# Patient Record
Sex: Male | Born: 2000 | Race: Black or African American | Hispanic: No | Marital: Single | Smoking: Never smoker
Health system: Southern US, Community
[De-identification: ages and names within clinical notes are randomized; demographics above are authoritative.]

## PROBLEM LIST (undated history)

## (undated) DIAGNOSIS — L509 Urticaria, unspecified: Secondary | ICD-10-CM

## (undated) DIAGNOSIS — L309 Dermatitis, unspecified: Secondary | ICD-10-CM

## (undated) HISTORY — PX: WISDOM TOOTH EXTRACTION: SHX21

## (undated) HISTORY — DX: Urticaria, unspecified: L50.9

## (undated) HISTORY — DX: Dermatitis, unspecified: L30.9

---
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
## 2022-01-12 ENCOUNTER — Encounter: Payer: Self-pay | Admitting: Urology

## 2022-01-12 ENCOUNTER — Telehealth: Payer: Self-pay

## 2022-01-12 ENCOUNTER — Other Ambulatory Visit
Admission: RE | Admit: 2022-01-12 | Discharge: 2022-01-12 | Disposition: A | Discharge status: Home / Self Care | Payer: Medicaid Other | Attending: Urology | Admitting: Urology

## 2022-01-12 ENCOUNTER — Other Ambulatory Visit: Payer: Self-pay | Admitting: *Deleted

## 2022-01-12 ENCOUNTER — Ambulatory Visit (INDEPENDENT_AMBULATORY_CARE_PROVIDER_SITE_OTHER): Payer: Medicaid Other | Admitting: Urology

## 2022-01-12 VITALS — BP 127/75 | HR 59 | Ht 74.0 in | Wt 208.0 lb

## 2022-01-12 DIAGNOSIS — R3989 Other symptoms and signs involving the genitourinary system: Secondary | ICD-10-CM

## 2022-01-12 DIAGNOSIS — R300 Dysuria: Secondary | ICD-10-CM | POA: Insufficient documentation

## 2022-01-12 DIAGNOSIS — Z113 Encounter for screening for infections with a predominantly sexual mode of transmission: Secondary | ICD-10-CM

## 2022-01-12 LAB — BLADDER SCAN AMB NON-IMAGING

## 2022-01-12 LAB — URINALYSIS, COMPLETE (UACMP) WITH MICROSCOPIC
Bilirubin Urine: NEGATIVE
Glucose, UA: NEGATIVE mg/dL
Hgb urine dipstick: NEGATIVE
Ketones, ur: NEGATIVE mg/dL
Nitrite: NEGATIVE
Protein, ur: NEGATIVE mg/dL
Specific Gravity, Urine: 1.025 (ref 1.005–1.030)
pH: 6 (ref 5.0–8.0)

## 2022-01-12 MED ORDER — DOXYCYCLINE HYCLATE 100 MG PO CAPS: 100.0000 mg | ORAL_CAPSULE | Freq: Two times a day (BID) | ORAL | 0 refills | Status: DC

## 2022-01-12 NOTE — Telephone Encounter (Signed)
Called pt no answer. Left detailed message for pt per DPR. RX sent.

## 2022-01-12 NOTE — Patient Instructions (Signed)
Urethritis, Adult  Urethritis is a swelling (inflammation) of the urethra. The urethra is the tube that drains urine from the bladder. It is important to get treatment for this condition early. Delayed treatment may lead to complications, such as an infection in the urinary tract (ureters, kidneys, and bladder). What are the causes? This condition may be caused by: Germs that are spread through sexual contact. This is the leading cause of urethritis. This may include bacterial or viral infections. Injury to the urethra. This can happen after a thin, flexible tube (catheter) is inserted into the urethra to drain urine, or after medical instruments or foreign bodies are inserted into the area. Chemical irritation. This may include contact with spermicide or prolonged contact with chemicals in bubble bath, shampoo, or perfumed soaps. A disease that causes inflammation. This is rare. What increases the risk? The following factors may make you more likely to develop this condition: Having sex without using a condom. Having multiple sexual partners. Having poor hygiene. What are the signs or symptoms? Symptoms of this condition include: Pain with urination. Frequent urination. An urgent need to urinate. Itching and pain in the vagina or penis. Discharge or bleeding coming from the penis. Most women have no symptoms. How is this diagnosed? This condition may be diagnosed based on: Your symptoms. Your medical history. A physical exam. Tests may also be done. These may include: Urine tests. Swabs from the urethra. How is this treated? Treatment for this condition depends on the cause. Urethritis caused by a bacterial infection is treated with antibiotic medicine. Sexual partners must also be treated. Follow these instructions at home: Medicines Take over-the-counter and prescription medicines only as told by your health care provider. If you were prescribed an antibiotic, take it as told  by your health care provider. Do not stop taking the antibiotic even if you start to feel better. Lifestyle Avoid using perfumed soaps, bubble bath, and shampoo when you bathe or shower. Rinse the vaginal area after bathing. Wear cotton underwear. Not wearing underwear when going to sleep can help. Make sure to wipe from front to back after using the toilet if you are male. Do not have sex until your health care provider approves. When you do have sex, be sure to practice safe sex. Any sexual partners you have had in the past 60 days should be treated. General instructions Drink enough fluid to keep your urine pale yellow. It is up to you to get your test results. Ask your health care provider, or the department that is doing the test, when your results will be ready. Keep all follow-up visits. This is important. Get tested again 3 months after treatment to make sure the infection is gone. It is important that your sexual partner also gets tested again. Contact a health care provider if: Your symptoms have not improved after 3 days. Your symptoms get worse. You have eye redness or pain. You develop abdominal pain or pelvic pain (in females). You develop joint pain or a rash. You have a fever or chills. Get help right away if: You have severe pain in the belly, back, or side. You vomit repeatedly. Summary Urethritis is a swelling (inflammation) of the urethra. Germs that are spread through sexual contact are the most common cause of this condition. It is important to get treatment for this condition early. Delayed treatment may lead to complications. Treatment for this condition depends on the cause. Any sexual partners must also be treated. This information is not   intended to replace advice given to you by your health care provider. Make sure you discuss any questions you have with your health care provider. Document Revised: 01/20/2020 Document Reviewed: 01/20/2020 Elsevier Patient  Education  2023 Elsevier Inc.   Preventing Sexually Transmitted Infections, Adult Sexually transmitted infections (STIs) are spread from person to person (are contagious). They are spread, or transmitted, through bodily fluids exchanged during sex or sexual contact. These bodily fluids include saliva, semen, blood, vaginal mucus, and urine. STIs are very common and can occur among people of all ages. Some common STIs include: Herpes. Hepatitis B. Chlamydia. Gonorrhea. Syphilis. HPV (human papillomavirus). HIV, also called the human immunodeficiency virus. This is the virus that can cause AIDS (acquired immunodeficiency syndrome). Often, people who have these STIs do not have symptoms. Even without symptoms, these infections can be spread from person to person and require treatment. How can these conditions affect me? STIs can be treated, and many STIs can be cured. However, some STIs cannot be cured and will affect you for the rest of your life. Certain STIs may: Require you to take medicine for the rest of your life. Affect your ability to have children (your fertility). Increase your risk for developing another STI or certain serious health conditions. These may include: Cervical cancer. Head and neck cancer. Pelvic inflammatory disease (PID), in women. Organ damage or damage to other parts of your body, if the infection spreads. Cause problems during pregnancy and may be transmitted to the baby during the pregnancy or childbirth. What can increase my risk? You may have an increased risk for developing an STI if: You have unprotected sex or sexual contact. You have more than one sex partner. You have a sex partner who has multiple sex partners. You have sexual contact with someone who has an STI. You have an STI or you had an STI before. You inject drugs or have a sex partner or partners who inject drugs. What actions can I take to prevent STIs? The only way to completely prevent  STIs is not to have sex of any kind. This is called practicing abstinence. If you are sexually active, you can protect yourself and others by taking these actions to lower your risk of getting an STI: Lifestyle Avoid mixing alcohol, drugs, and sex. Alcohol and drug use can affect your ability to make good decisions and can lead to risky sexual behaviors. Medicines Ask your health care provider about taking pre-exposure prophylaxis (PrEP) to prevent HIV infection. General information  Stay up to date on vaccinations. Certain vaccines can lower your risk of getting certain STIs, such as: Hepatitis A and hepatitis B vaccines. HPV (human papillomavirus) vaccine. Have only one sex partner (be monogamous) or limit the number of sex partners you have. Use methods that prevent the exchange of body fluids between partners (barrier protection) correctly every time you have sexual contact. Barrier protection can be used during oral, vaginal, or anal sex. Commonly used barrier methods include: External (male) condom. Internal (male) condom. Dental dam. Use a new barrier method for every sex act from start to finish. Get tested for STIs. Have your partners get tested, too. If you test positive for an STI, follow recommendations from your health care provider about treatment and make sure your sex partners are tested and treated. Birth control pills, injections, implants, and intrauterine devices (IUDs) do not protect against STIs. To prevent both STIs and pregnancy, always use a condom with another form of birth control. Some STIs,  such as herpes, are spread through skin-to-skin contact. A barrier method may not protect you from getting such STIs. Avoid all sexual contact if you or your partners have herpes and there is an active flare with open sores. Where to find more information Learn more about STIs from: Centers for Disease Control and Prevention: More information about specific STIs:  BloggingList.ca Places to get sexual health counseling and treatment for free or at a low cost: gettested.TonerPromos.no U.S. Department of Health and Human Services: http://hoffman.com/ Summary Sexually transmitted infections (STIs) can spread through exchanging bodily fluids during sexual contact. Fluids include saliva, semen, blood, vaginal mucus, and urine. You may have an increased risk for developing an STI if you have unprotected sex. If you do have sex, limit your number of sex partners and use barrier protection every time you have sex. This information is not intended to replace advice given to you by your health care provider. Make sure you discuss any questions you have with your health care provider. Document Revised: 06/08/2021 Document Reviewed: 07/30/2019 Elsevier Patient Education  2023 [employer].

## 2022-01-12 NOTE — Telephone Encounter (Signed)
-----   Message from Sondra Come, MD sent at 01/12/2022  1:05 PM EDT ----- Urinalysis today is suspicious for infection.  Recommend doxycycline 100 mg twice daily x7 days.  Cautioned him that this can cause rash if significant sun exposure and to wear long sleeves, hat, and sunscreen if out in the sun.  We will call with final culture and STD results and if antibiotics need to be changed  Legrand Rams, MD 01/12/2022

## 2022-01-12 NOTE — Progress Notes (Signed)
   01/12/22 12:33 PM   Derek Holland January 02, 2001 [id number]  CC: Penile discharge, dysuria  HPI: I saw Mr. Derek Holland today for the above issues.  He reports about a month of some mucousy discharge from the penis as well as some burning with urination.  He took some over-the-counter Pyridium which almost completely resolved the dysuria, and he really denies any significant complaints today aside from some mild mucus discharge.  This seems to have improved over the last few weeks as well.  He denies any gross hematuria.  Urinalysis with PCP was benign and he was referred to urology.  I do not see that any STD testing was performed.  He is sexually active with 1 partner.  Urinalysis today suspicious with moderate leukocytes, 0-5 squamous cells, 20-50 WBCs, 6-10 RBCs, few bacteria.  Sent for culture, atypicals, and STDs.  PMH: Past Medical History:  Diagnosis Date   Eczema    Urticaria     Surgical History: Past Surgical History:  Procedure Laterality Date   COLONOSCOPY WITH PROPOFOL N/A 03/27/2021   Procedure: COLONOSCOPY WITH PROPOFOL;  Surgeon: Pasty Spillers, MD;  Location: [facility] ENDOSCOPY;  Service: Endoscopy;  Laterality: N/A;   ESOPHAGOGASTRODUODENOSCOPY N/A 03/27/2021   Procedure: ESOPHAGOGASTRODUODENOSCOPY (EGD);  Surgeon: Pasty Spillers, MD;  Location: [facility] ENDOSCOPY;  Service: Endoscopy;  Laterality: N/A;   WISDOM TOOTH EXTRACTION      Social History:  reports that he has never smoked. He has never been exposed to tobacco smoke. He has never used smokeless tobacco. He reports that he does not drink alcohol and does not use drugs.  Physical Exam: BP 127/75   Pulse (!) 59   Ht 6\' 2"  (1.88 m)   Wt 208 lb (94.3 kg)   BMI 26.71 kg/m    Constitutional:  Alert and oriented, No acute distress. Cardiovascular: No clubbing, cyanosis, or edema. Respiratory: Normal respiratory effort, no increased work of breathing. GI: Abdomen is soft, nontender, nondistended, no abdominal  masses GU: Circumcised phallus with patent meatus, no lesions, testicles 20 cc and descended bilaterally without masses   Assessment & Plan:   21 year old newly sexually active male with 1 male partner who reports about 1 month of some penile discharge as well as a intermittent dysuria that is improving.  Exam today is benign but urinalysis suspicious for infection with 0-5 squamous cells, 20-50 WBCs, 6-10 RBCs, few bacteria, moderate leukocytes, sent for urine culture, atypicals, and STD panel.  We will call with urine culture and STD results Consider empiric course of doxycycline if culture is negative based on suspicious urinalysis  12, MD 01/12/2022  [facility] [address] [place], [place] [phone]

## 2022-01-13 LAB — URINE CULTURE: Culture: NO GROWTH

## 2022-01-15 ENCOUNTER — Telehealth: Payer: Self-pay

## 2022-01-15 LAB — GC/CHLAMYDIA PROBE AMP
Chlamydia trachomatis, NAA: POSITIVE — AB
Neisseria Gonorrhoeae by PCR: NEGATIVE

## 2022-01-15 NOTE — Telephone Encounter (Signed)
Called pt no answer. LM for pt to call back. 1st attempt  

## 2022-01-15 NOTE — Telephone Encounter (Signed)
Pt returns call. Informed pt of information below. Pt advised on safe sex practices.

## 2022-01-15 NOTE — Telephone Encounter (Signed)
-----   Message from Sondra Come, MD sent at 01/15/2022 12:44 PM EDT ----- His STD test was positive for chlamydia, this will be covered by the doxycycline he was prescribed.  He needs to have his partner(s) tested and they should both complete antibiotics prior to resuming sexual activity  Legrand Rams, MD 01/15/2022

## 2022-01-19 LAB — MISC LABCORP TEST (SEND OUT): Labcorp test code: 86884

## 2022-06-11 ENCOUNTER — Ambulatory Visit: Payer: Medicaid Other | Admitting: Allergy

## 2022-07-16 ENCOUNTER — Other Ambulatory Visit: Payer: Self-pay

## 2022-07-16 ENCOUNTER — Encounter: Payer: Self-pay | Admitting: Allergy

## 2022-07-16 ENCOUNTER — Ambulatory Visit (INDEPENDENT_AMBULATORY_CARE_PROVIDER_SITE_OTHER): Payer: Medicaid Other | Admitting: Allergy

## 2022-07-16 VITALS — BP 124/76 | HR 63 | Temp 98.3°F | Resp 18

## 2022-07-16 DIAGNOSIS — J3089 Other allergic rhinitis: Secondary | ICD-10-CM

## 2022-07-16 DIAGNOSIS — L508 Other urticaria: Secondary | ICD-10-CM

## 2022-07-16 DIAGNOSIS — H1013 Acute atopic conjunctivitis, bilateral: Secondary | ICD-10-CM

## 2022-07-16 DIAGNOSIS — T781XXD Other adverse food reactions, not elsewhere classified, subsequent encounter: Secondary | ICD-10-CM

## 2022-07-16 MED ORDER — CETIRIZINE HCL 10 MG PO TABS
10.0000 mg | ORAL_TABLET | Freq: Every day | ORAL | 1 refills | Status: DC
Start: 2022-07-16 — End: 2022-11-25

## 2022-07-16 MED ORDER — EPIPEN 2-PAK 0.3 MG/0.3ML IJ SOAJ: INTRAMUSCULAR | 1 refills | Status: DC

## 2022-07-16 MED ORDER — FAMOTIDINE 20 MG PO TABS: 20.0000 mg | ORAL_TABLET | Freq: Every day | ORAL | 5 refills | Status: DC

## 2022-07-16 NOTE — Patient Instructions (Addendum)
Adverse food reaction - continue avoidance of nuts, dairy in the diet - you are eating tomato products without issue thus keep in diet.   If you are eating red-dye containing products without issue then can keep in the diet.  - have access to self-injectable epinephrine (Epipen or AuviQ) 0.3mg  at all times - follow emergency action plan in case of allergic reaction  - we have previously discussed the following in regards to foods:   Allergy: food allergy is when you have eaten a food, developed an allergic reaction after eating the food and have IgE to the food (positive food testing either by skin testing or blood testing).  Food allergy could lead to life threatening symptoms  Sensitivity: occurs when you have IgE to a food (positive food testing either by skin testing or blood testing) but is a food you eat without any issues.  This is not an allergy and we recommend keeping the food in the diet  Intolerance: this is when you have negative testing by either skin testing or blood testing thus not allergic but the food causes symptoms (like belly pain, bloating, diarrhea etc) with ingestion.  These foods should be avoided to prevent symptoms.    Chronic hives  - at this time etiology of hives and swelling is spontaneous in nature but increase in body temperature does flare hives.  Hives can be caused by a variety of different triggers including illness/infection, foods, medications, stings, exercise, pressure, vibrations, extremes of temperature to name a few however majority of the time there is no identifiable trigger.   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  Discussed today and informational handout provided  Environmental allergy - continue avoidance measures for mold, dust mites, tree pollen, dog dander and weed  pollen - Zyrtec daily as above for symptom control  Follow-up in 3-4 months or sooner if needed

## 2022-07-16 NOTE — Progress Notes (Signed)
Follow-up Note  RE: Derek Holland MRN: [id number] DOB: 09/18/2000 Date of Office Visit: 07/16/2022   History of present illness: Derek Holland is a 22 y.o. male presenting today for follow-up of adverse food reaction chronic hives and allergic rhinitis.  He was last seen in the office on 12/10/2021 by myself.  He presents today with his mother.  He has not had any major health changes, surgeries or hospitalizations since last visit.  He states he continues to have hives pretty frequently as showering with warm water to trigger as well as exercising. He states he does not recall taking a daily antihsitamine for hive control or pepcid.  Thus he has not been taking anything for his hives.  He states he does wake up with nasal congestion.  Dog does sleep in the room.  Not taking anything for this.  He reports he does avoid nuts and dairy in the diet.  He has an epipen and he states its in his gym bag however the gym bag pretty much stays in the house.  Review of systems: Review of Systems  Constitutional: Negative.   HENT:  Positive for congestion.   Eyes: Negative.   Respiratory: Negative.    Cardiovascular: Negative.   Musculoskeletal: Negative.   Skin:  Positive for rash.  Allergic/Immunologic: Negative.   Neurological: Negative.      All other systems negative unless noted above in HPI  Past medical/social/surgical/family history have been reviewed and are unchanged unless specifically indicated below.  No changes  Medication List: Current Outpatient Medications  Medication Sig Dispense Refill   famotidine (PEPCID) 20 MG tablet Take 1 tablet (20 mg total) by mouth daily. 30 tablet 5   magnesium 30 MG tablet Take 30 mg by mouth daily.     pantoprazole (PROTONIX) 40 MG tablet Take 40 mg by mouth daily as needed.     cetirizine (ZYRTEC ALLERGY) 10 MG tablet Take 1 tablet (10 mg total) by mouth daily. 90 tablet 1   EPIPEN 2-PAK 0.3 MG/0.3ML SOAJ injection Use as directed for life  threatening allergic reactions 4 each 1   No current facility-administered medications for this visit.     Known medication allergies: No Known Allergies   Physical examination: Blood pressure 124/76, pulse 63, temperature 98.3 F (36.8 C), temperature source Temporal, resp. rate 18, SpO2 98 %.  General: Alert, interactive, in no acute distress. HEENT: PERRLA, TMs pearly gray, turbinates mildly edematous without discharge, post-pharynx non erythematous. Neck: Supple without lymphadenopathy. Lungs: Clear to auscultation without wheezing, rhonchi or rales. {no increased work of breathing. CV: Normal S1, S2 without murmurs. Abdomen: Nondistended, nontender. Skin: Warm and dry, without lesions or rashes. Extremities:  No clubbing, cyanosis or edema. Neuro:   Grossly intact.  Diagnositics/Labs: None today  Assessment and plan:   Adverse food reaction - continue avoidance of nuts, dairy in the diet - you are eating tomato products without issue thus keep in diet.   If you are eating red-dye containing products without issue then can keep in the diet.  - have access to self-injectable epinephrine (Epipen or AuviQ) 0.3mg  at all times - follow emergency action plan in case of allergic reaction  - we have previously discussed the following in regards to foods:   Allergy: food allergy is when you have eaten a food, developed an allergic reaction after eating the food and have IgE to the food (positive food testing either by skin testing or blood testing).  Food allergy  could lead to life threatening symptoms  Sensitivity: occurs when you have IgE to a food (positive food testing either by skin testing or blood testing) but is a food you eat without any issues.  This is not an allergy and we recommend keeping the food in the diet  Intolerance: this is when you have negative testing by either skin testing or blood testing thus not allergic but the food causes symptoms (like belly pain,  bloating, diarrhea etc) with ingestion.  These foods should be avoided to prevent symptoms.    Chronic hives  - at this time etiology of hives and swelling is spontaneous in nature but increase in body temperature does flare hives.  Hives can be caused by a variety of different triggers including illness/infection, foods, medications, stings, exercise, pressure, vibrations, extremes of temperature to name a few however majority of the time there is no identifiable trigger.   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  Discussed today including benefits, risks and protocol and informational handout provided  Environmental allergy - continue avoidance measures for mold, dust mites, tree pollen, dog dander and weed pollen - Zyrtec daily as above for symptom control  Follow-up in 3-4 months or sooner if needed  I appreciate the opportunity to take part in Derek Holland's care. Please do not hesitate to contact me with questions.  Sincerely,   Prudy Feeler, MD Allergy/Immunology [facility]

## 2022-10-29 ENCOUNTER — Ambulatory Visit: Payer: Medicaid Other | Admitting: Allergy

## 2022-11-25 ENCOUNTER — Encounter: Payer: Self-pay | Admitting: Allergy

## 2022-11-25 ENCOUNTER — Other Ambulatory Visit: Payer: Self-pay

## 2022-11-25 ENCOUNTER — Ambulatory Visit (INDEPENDENT_AMBULATORY_CARE_PROVIDER_SITE_OTHER): Payer: Medicaid Other | Admitting: Allergy

## 2022-11-25 VITALS — BP 124/80 | HR 64 | Temp 98.6°F | Resp 16 | Ht 72.5 in | Wt 233.1 lb

## 2022-11-25 DIAGNOSIS — J3089 Other allergic rhinitis: Secondary | ICD-10-CM | POA: Diagnosis not present

## 2022-11-25 DIAGNOSIS — L508 Other urticaria: Secondary | ICD-10-CM

## 2022-11-25 DIAGNOSIS — T781XXD Other adverse food reactions, not elsewhere classified, subsequent encounter: Secondary | ICD-10-CM

## 2022-11-25 DIAGNOSIS — H1013 Acute atopic conjunctivitis, bilateral: Secondary | ICD-10-CM | POA: Diagnosis not present

## 2022-11-25 DIAGNOSIS — S50861A Insect bite (nonvenomous) of right forearm, initial encounter: Secondary | ICD-10-CM

## 2022-11-25 MED ORDER — CETIRIZINE HCL 10 MG PO TABS: 10.0000 mg | ORAL_TABLET | Freq: Every day | ORAL | 1 refills | Status: DC

## 2022-11-25 MED ORDER — FAMOTIDINE 20 MG PO TABS: 20.0000 mg | ORAL_TABLET | Freq: Every day | ORAL | 5 refills | Status: DC

## 2022-11-25 NOTE — Patient Instructions (Addendum)
Adverse food reaction - continue avoidance of nuts, dairy in the diet. - have access to self-injectable epinephrine (Epipen or AuviQ) 0.3mg  at all times. - follow emergency action plan in case of allergic reaction. - Xolair injections discussed as it has a new FDA indication for food allergy management.   - we have previously discussed the following in regards to foods:   Allergy: food allergy is when you have eaten a food, developed an allergic reaction after eating the food and have IgE to the food (positive food testing either by skin testing or blood testing).  Food allergy could lead to life threatening symptoms  Sensitivity: occurs when you have IgE to a food (positive food testing either by skin testing or blood testing) but is a food you eat without any issues.  This is not an allergy and we recommend keeping the food in the diet  Intolerance: this is when you have negative testing by either skin testing or blood testing thus not allergic but the food causes symptoms (like belly pain, bloating, diarrhea etc) with ingestion.  These foods should be avoided to prevent symptoms.    Chronic hives  - at this time etiology of hives and swelling is spontaneous in nature but increase in body temperature does flare hives.  Hives can be caused by a variety of different triggers including illness/infection, foods, medications, stings, exercise, pressure, vibrations, extremes of temperature to name a few however majority of the time there is no identifiable trigger.   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  Discussed and informational handout provided  Environmental allergy - continue avoidance measures for mold, dust mites, tree pollen, dog dander and weed pollen - Zyrtec daily as above for symptom control  Tick bite - looks like you  got all the tick off skin - monitor for symptoms of illness - at this time tick was not on skin long enough to warrant prophylactic antibiotic - recommend performing skin check when you get inside for the day  Follow-up in 6 months or sooner if needed

## 2022-11-25 NOTE — Progress Notes (Signed)
Follow-up Note  RE: Derek Holland MRN: [id number] DOB: 2001-06-11 Date of Office Visit: 11/25/2022   History of present illness: Derek Holland is a 22 y.o. male presenting today for follow-up of food allergy, urticaria and allergic rhinitis.  He presents today with his mother.  He was last seen in the office on 07/16/2022 by myself.  He does feel his hives now are better that he is taking the zyrtec and pepcid.  Mother states the hives occur when he misses his medications.  He is taking Zyrtec and Pepcid daily now.  Because he is taking his antihistamines on a more consistent basis he is also noting less allergy symptoms. He continues to avoid nuts and dairy products.  He has not had any accidental ingestions or need to his epinephrine device.  However he does state he oftentimes leaves his epinephrine device at home when he is not at home. He did pull what he believes to be a tick off of his right arm today.  He believes it was there for no longer than 24 hours.  He is not noting any symptoms of illness.  Review of systems in the past 4 weeks: Review of Systems  Constitutional: Negative.   HENT: Negative.    Eyes: Negative.   Respiratory: Negative.    Cardiovascular: Negative.   Musculoskeletal: Negative.   Skin: Negative.   Allergic/Immunologic: Negative.   Neurological: Negative.      All other systems negative unless noted above in HPI  Past medical/social/surgical/family history have been reviewed and are unchanged unless specifically indicated below.  No changes  Medication List: Current Outpatient Medications  Medication Sig Dispense Refill   EPIPEN 2-PAK 0.3 MG/0.3ML SOAJ injection Use as directed for life threatening allergic reactions 4 each 1   magnesium 30 MG tablet Take 30 mg by mouth daily.     pantoprazole (PROTONIX) 40 MG tablet Take 40 mg by mouth daily as needed.     cetirizine (ZYRTEC ALLERGY) 10 MG tablet Take 1 tablet (10 mg total) by mouth daily. 30  tablet 1   famotidine (PEPCID) 20 MG tablet Take 1 tablet (20 mg total) by mouth daily. 30 tablet 5   No current facility-administered medications for this visit.     Known medication allergies: No Known Allergies   Physical examination: Blood pressure 124/80, pulse 64, temperature 98.6 F (37 C), temperature source Temporal, resp. rate 16, height 6' 0.5" (1.842 m), weight 233 lb 1.6 oz (105.7 kg), SpO2 98 %.  General: Alert, interactive, in no acute distress. HEENT: PERRLA, TMs pearly gray, turbinates non-edematous without discharge, post-pharynx non erythematous. Neck: Supple without lymphadenopathy. Lungs: Clear to auscultation without wheezing, rhonchi or rales. {no increased work of breathing. CV: Normal S1, S2 without murmurs. Abdomen: Nondistended, nontender. Skin: Warm and dry, without lesions or rashes. Extremities:  No clubbing, cyanosis or edema. Neuro:   Grossly intact.  Diagnositics/Labs: None today  Assessment and plan:   Adverse food reaction - continue avoidance of nuts, dairy in the diet. - have access to self-injectable epinephrine (Epipen or AuviQ) 0.3mg  at all times. - follow emergency action plan in case of allergic reaction. - Xolair injections discussed as it has a new FDA indication for food allergy management.   - we have previously discussed the following in regards to foods:   Allergy: food allergy is when you have eaten a food, developed an allergic reaction after eating the food and have IgE to the food (positive food testing  either by skin testing or blood testing).  Food allergy could lead to life threatening symptoms  Sensitivity: occurs when you have IgE to a food (positive food testing either by skin testing or blood testing) but is a food you eat without any issues.  This is not an allergy and we recommend keeping the food in the diet  Intolerance: this is when you have negative testing by either skin testing or blood testing thus not allergic  but the food causes symptoms (like belly pain, bloating, diarrhea etc) with ingestion.  These foods should be avoided to prevent symptoms.    Chronic hives  - at this time etiology of hives and swelling is spontaneous in nature but increase in body temperature does flare hives.  Hives can be caused by a variety of different triggers including illness/infection, foods, medications, stings, exercise, pressure, vibrations, extremes of temperature to name a few however majority of the time there is no identifiable trigger.   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  Discussed and informational handout provided  Environmental allergy - continue avoidance measures for mold, dust mites, tree pollen, dog dander and weed pollen - Zyrtec daily as above for symptom control  Tick bite - looks like you got all the tick off skin - monitor for symptoms of illness - at this time tick was not on skin long enough to warrant prophylactic antibiotic - recommend performing skin check when you get inside for the day  Follow-up in 6 months or sooner if needed  I appreciate the opportunity to take part in Derek Holland's care. Please do not hesitate to contact me with questions.  Sincerely,   Margo Aye, MD Allergy/Immunology [facility]

## 2022-12-06 ENCOUNTER — Other Ambulatory Visit: Payer: Self-pay | Admitting: Allergy

## 2023-01-06 IMAGING — US US ABDOMEN LIMITED
1 series · 15 of 25 positions shown · non-contrast
Comparison: None.

CLINICAL DATA: Generalized abdominal pain with nausea and diarrhea

EXAM:
ULTRASOUND ABDOMEN LIMITED RIGHT UPPER QUADRANT

[Series 1: us abdomen limited ruq · 15 of 30 slices shown]
[im 1/30]
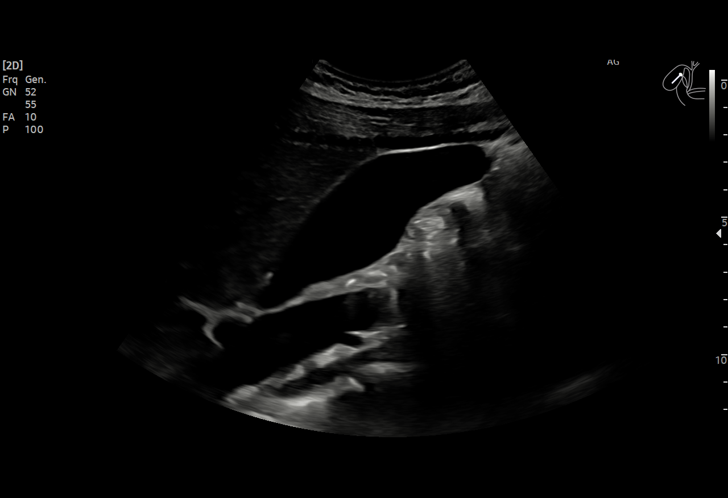
[im 3/30]
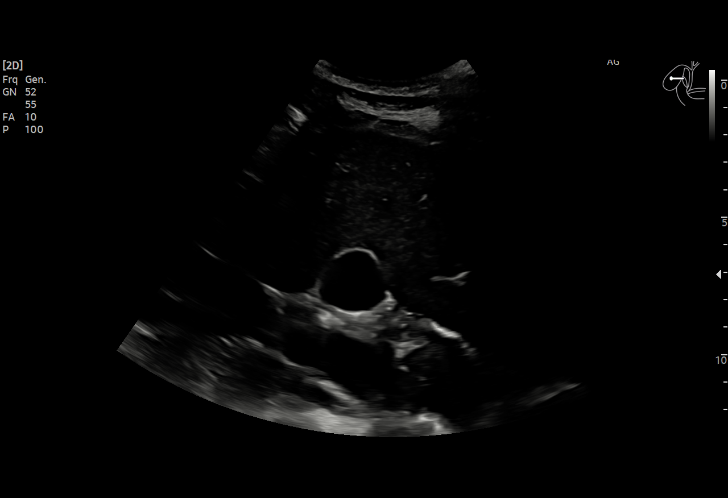
[im 5/30]
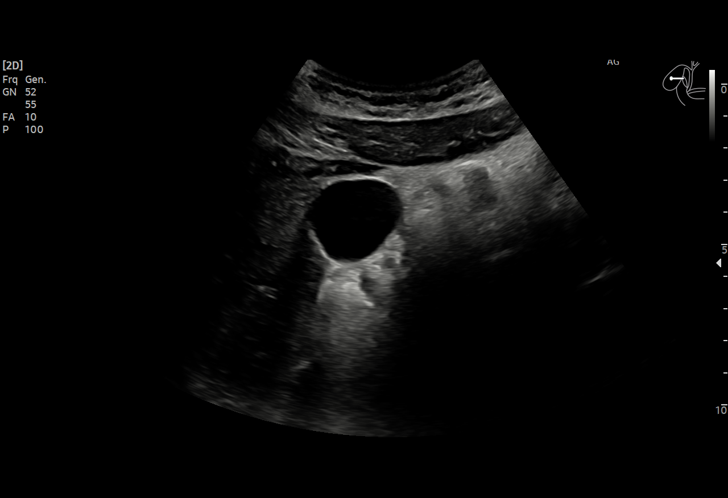
[im 7/30]
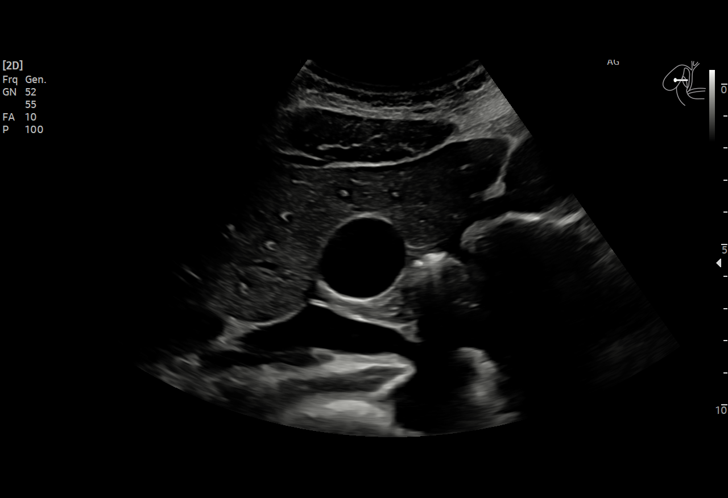
[im 9/30]
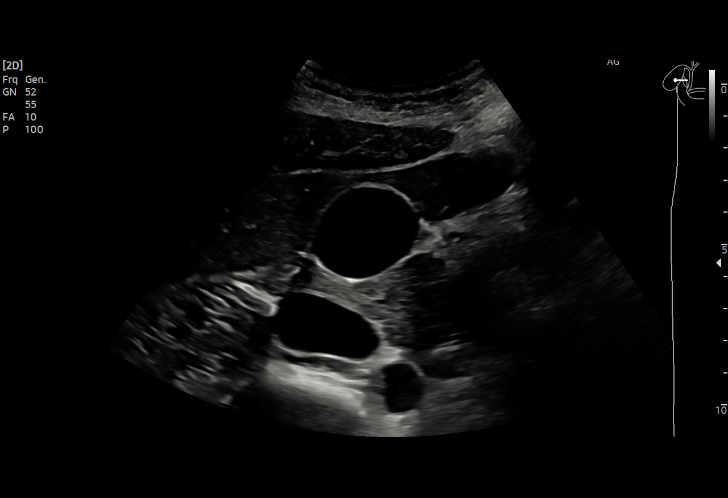
[im 11/30]
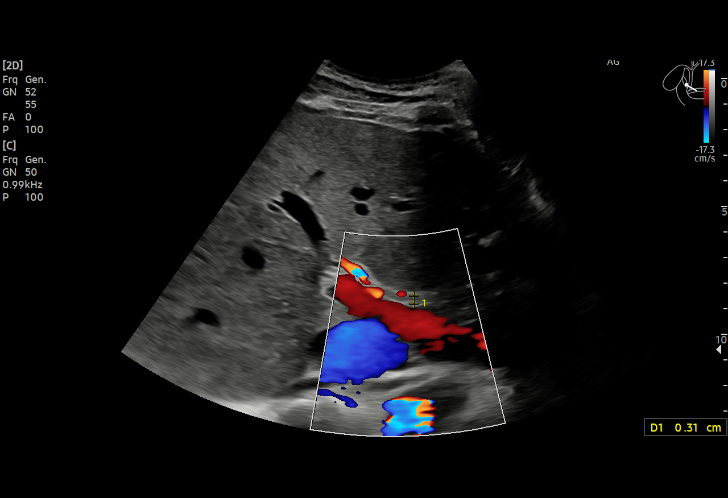
[im 13/30]
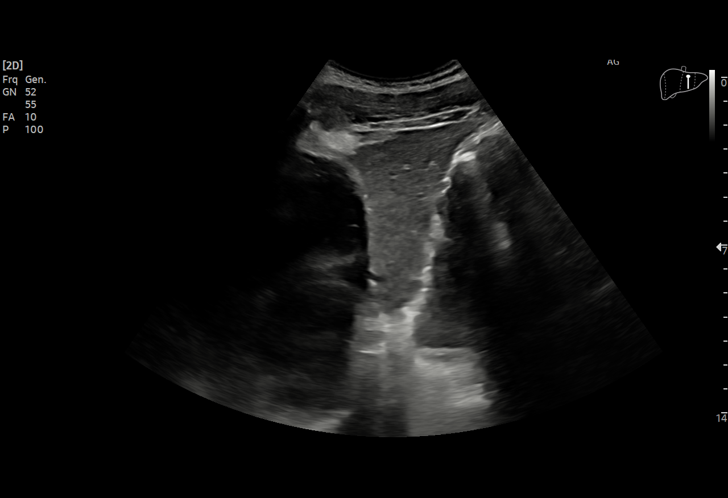
[im 15/30]
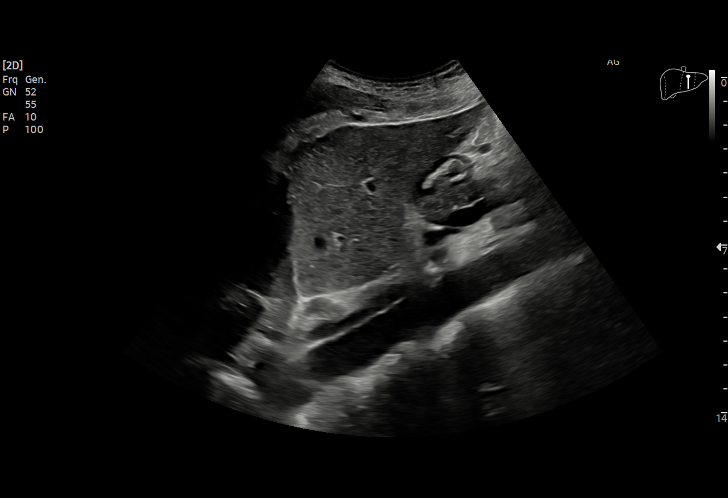
[im 17/30]
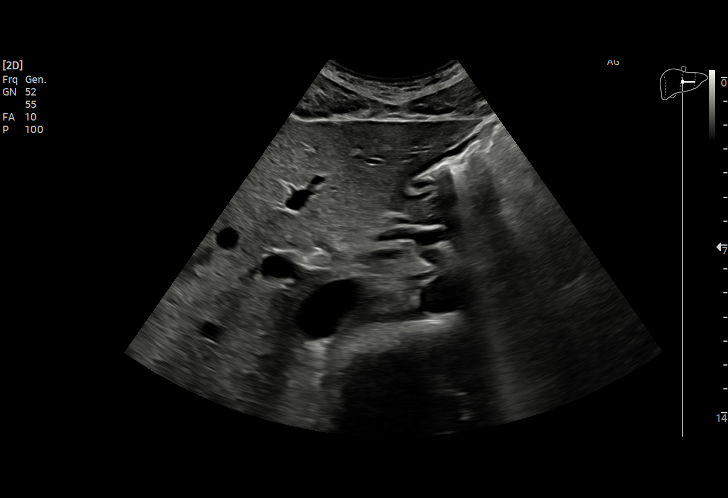
[im 19/30]
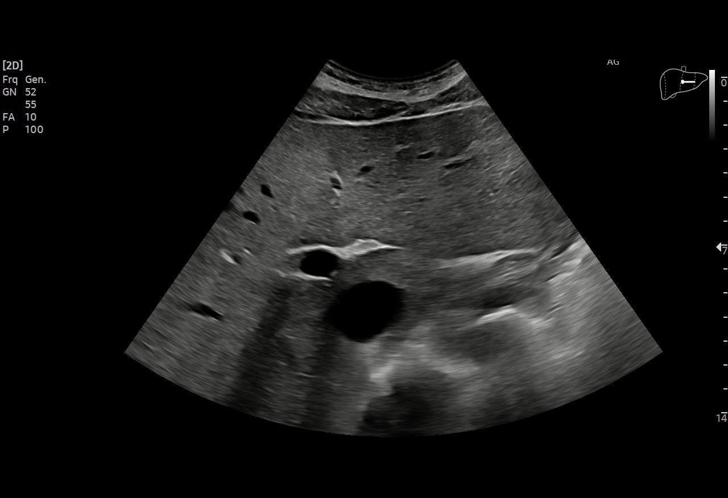
[im 21/30]
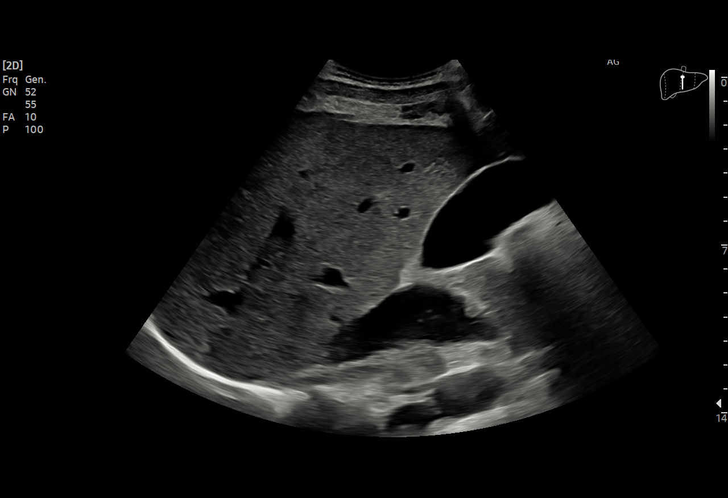
[im 23/30]
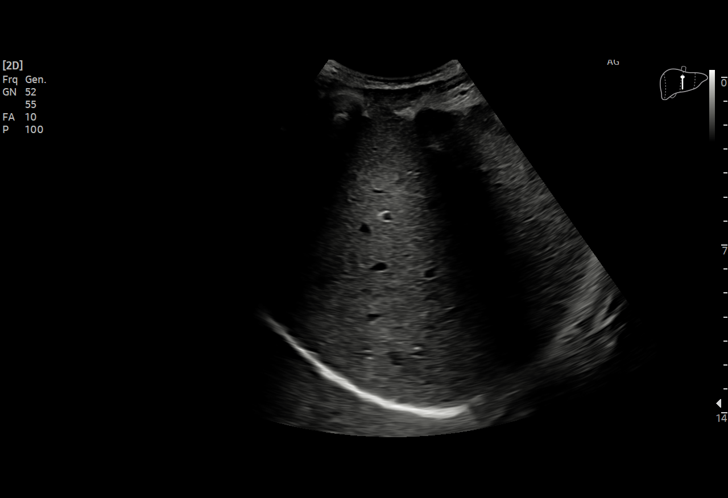
[im 25/30]
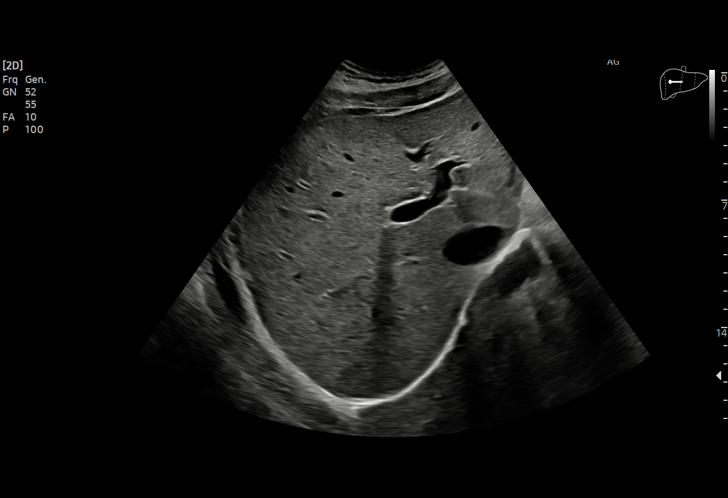
[im 27/30]
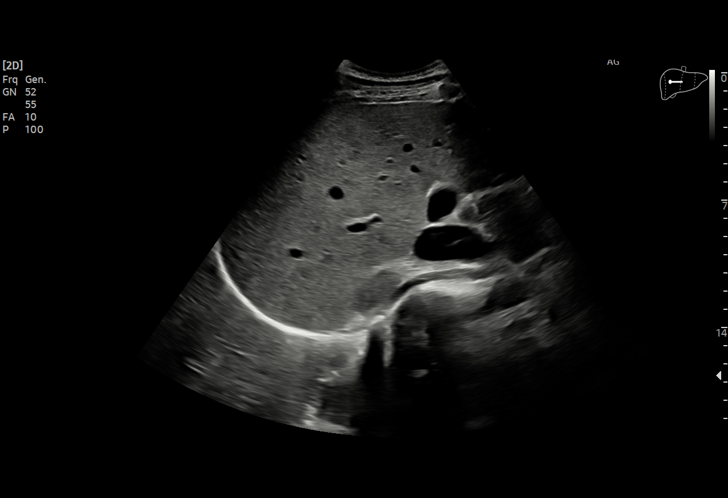
[im 30/30]
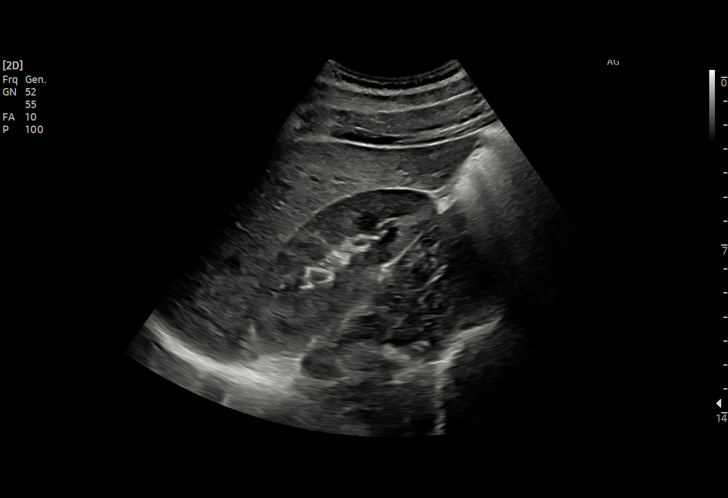

[15 of 25 positions shown; findings below may reference images not displayed]

FINDINGS: Gallbladder:

No gallstones or wall thickening visualized. No sonographic Murphy
sign noted by sonographer.

Common bile duct:

Diameter: 3 mm

Liver:

Mild increased hepatic echogenicity suggesting component of hepatic
steatosis. No focal abnormality by ultrasound. No biliary dilatation
or obstruction pattern. Portal vein is patent on color Doppler
imaging with normal direction of blood flow towards the liver.

Other: No free fluid or ascites. Included views of the right kidney
demonstrate no hydronephrosis.
IMPRESSION: Query mild hepatic steatosis.

No other acute finding by right upper quadrant ultrasound.

## 2023-02-03 ENCOUNTER — Other Ambulatory Visit: Payer: Self-pay | Admitting: Allergy

## 2023-03-24 ENCOUNTER — Ambulatory Visit (INDEPENDENT_AMBULATORY_CARE_PROVIDER_SITE_OTHER): Payer: Medicaid Other | Admitting: Allergy

## 2023-03-24 ENCOUNTER — Other Ambulatory Visit: Payer: Self-pay

## 2023-03-24 ENCOUNTER — Encounter: Payer: Self-pay | Admitting: Allergy

## 2023-03-24 VITALS — BP 120/78 | HR 68 | Temp 98.4°F | Resp 18 | Ht 72.84 in | Wt 246.2 lb

## 2023-03-24 DIAGNOSIS — T781XXD Other adverse food reactions, not elsewhere classified, subsequent encounter: Secondary | ICD-10-CM | POA: Diagnosis not present

## 2023-03-24 DIAGNOSIS — J3089 Other allergic rhinitis: Secondary | ICD-10-CM

## 2023-03-24 DIAGNOSIS — L508 Other urticaria: Secondary | ICD-10-CM

## 2023-03-24 MED ORDER — EPIPEN 2-PAK 0.3 MG/0.3ML IJ SOAJ: INTRAMUSCULAR | 1 refills | Status: DC

## 2023-03-24 NOTE — Patient Instructions (Addendum)
Adverse food reaction - continue avoidance of nuts, dairy (in moderation) in the diet. - have access to self-injectable epinephrine (Epipen or AuviQ) 0.3mg  at all times. - follow emergency action plan in case of allergic reaction. - we have previously discussed Xolair injections as a newer FDA indicated medication for food allergy management if we have accidental ingestions or reactions frequently  - we have previously discussed the following in regards to foods:   Allergy: food allergy is when you have eaten a food, developed an allergic reaction after eating the food and have IgE to the food (positive food testing either by skin testing or blood testing).  Food allergy could lead to life threatening symptoms  Sensitivity: occurs when you have IgE to a food (positive food testing either by skin testing or blood testing) but is a food you eat without any issues.  This is not an allergy and we recommend keeping the food in the diet  Intolerance: this is when you have negative testing by either skin testing or blood testing thus not allergic but the food causes symptoms (like belly pain, bloating, diarrhea etc) with ingestion.  These foods should be avoided to prevent symptoms.    Chronic hives  - at this time etiology of hives and swelling is spontaneous in nature but increase in body temperature does flare hives.  Hives can be caused by a variety of different triggers including illness/infection, foods, medications, stings, exercise, pressure, vibrations, extremes of temperature to name a few however majority of the time there is no identifiable trigger.   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    Environmental allergy - continue avoidance measures for mold, dust mites, tree  pollen, dog dander and weed pollen - Zyrtec daily as above for symptom control - use Flonase 2 sprays each nostril daily for 1-2 weeks at a time before stopping once nasal congestion improves for maximum benefit   Follow-up in 6-12 months or sooner if needed

## 2023-03-25 MED ORDER — FLUTICASONE PROPIONATE 50 MCG/ACT NA SUSP: 2.0000 | Freq: Every day | NASAL | 5 refills | Status: DC

## 2023-04-22 ENCOUNTER — Encounter: Payer: Self-pay | Admitting: Cardiology

## 2023-04-22 ENCOUNTER — Ambulatory Visit: Payer: Medicaid Other | Admitting: Cardiology

## 2023-04-22 VITALS — BP 129/78 | HR 74 | Ht 74.0 in | Wt 253.8 lb

## 2023-04-22 DIAGNOSIS — J069 Acute upper respiratory infection, unspecified: Secondary | ICD-10-CM | POA: Insufficient documentation

## 2023-04-22 DIAGNOSIS — Z0130 Encounter for examination of blood pressure without abnormal findings: Secondary | ICD-10-CM

## 2023-04-22 MED ORDER — BENZONATATE 100 MG PO CAPS: 100.0000 mg | ORAL_CAPSULE | Freq: Three times a day (TID) | ORAL | 1 refills | Status: DC | PRN

## 2023-04-22 MED ORDER — FLUTICASONE PROPIONATE 50 MCG/ACT NA SUSP
2.0000 | Freq: Every day | NASAL | 5 refills | Status: DC
Start: 2023-04-22 — End: 2023-11-24

## 2023-04-22 MED ORDER — AMOXICILLIN-POT CLAVULANATE 875-125 MG PO TABS: 1.0000 | ORAL_TABLET | Freq: Two times a day (BID) | ORAL | 0 refills | Status: AC

## 2023-04-22 MED ORDER — ALBUTEROL SULFATE HFA 108 (90 BASE) MCG/ACT IN AERS: 2.0000 | INHALATION_SPRAY | Freq: Four times a day (QID) | RESPIRATORY_TRACT | 2 refills | Status: DC | PRN

## 2023-04-22 NOTE — Patient Instructions (Addendum)
Flonase Mucinex Antihistamine Tessalon pearls Antibiotic Albuterol

## 2023-04-22 NOTE — Progress Notes (Signed)
Established Patient Office Visit  Subjective:  Patient ID: Derek Holland, male    DOB: 30-Oct-2000  Age: 22 y.o. MRN: [id number]  Chief Complaint  Patient presents with   Nasal Congestion    Pressure x2 weeks     Patient in office for an acute visit, complaining of nasal congestion and pressure for 2 weeks.Patient states he feels well otherwise. States he feels like he can't breathe. Has used Mucinex and Tylenol am and pm with no relief.     Sinusitis This is a new problem. The current episode started 1 to 4 weeks ago. The problem is unchanged. There has been no fever. He is experiencing no pain. Associated symptoms include congestion, coughing, headaches, sinus pressure and sneezing. Pertinent negatives include no ear pain, shortness of breath or sore throat. Past treatments include oral decongestants and nasal decongestants. The treatment provided no relief.    No other concerns at this time.   Past Medical History:  Diagnosis Date   Eczema    Urticaria     Past Surgical History:  Procedure Laterality Date   COLONOSCOPY WITH PROPOFOL N/A 03/27/2021   Procedure: COLONOSCOPY WITH PROPOFOL;  Surgeon: Pasty Spillers, MD;  Location: [facility] ENDOSCOPY;  Service: Endoscopy;  Laterality: N/A;   ESOPHAGOGASTRODUODENOSCOPY N/A 03/27/2021   Procedure: ESOPHAGOGASTRODUODENOSCOPY (EGD);  Surgeon: Pasty Spillers, MD;  Location: [facility] ENDOSCOPY;  Service: Endoscopy;  Laterality: N/A;   WISDOM TOOTH EXTRACTION      Social History   Socioeconomic History   Marital status: Single    Spouse name: Not on file   Number of children: Not on file   Years of education: Not on file   Highest education level: Not on file  Occupational History   Not on file  Tobacco Use   Smoking status: Never    Passive exposure: Never   Smokeless tobacco: Never  Vaping Use   Vaping status: Never Used  Substance and Sexual Activity   Alcohol use: Never   Drug use: Never   Sexual activity: Not on  file  Other Topics Concern   Not on file  Social History Narrative   Not on file   Social Determinants of Health   Financial Resource Strain: Not on file  Food Insecurity: Not on file  Transportation Needs: Not on file  Physical Activity: Not on file  Stress: Not on file  Social Connections: Not on file  Intimate Partner Violence: Not on file    History reviewed. No pertinent family history.  No Known Allergies  Review of Systems  Constitutional: Negative.   HENT:  Positive for congestion, sinus pressure, sinus pain and sneezing. Negative for ear pain and sore throat.        PND  Eyes: Negative.   Respiratory:  Positive for cough, sputum production and wheezing. Negative for shortness of breath.   Cardiovascular: Negative.  Negative for chest pain.  Gastrointestinal: Negative.  Negative for abdominal pain, constipation and diarrhea.  Genitourinary: Negative.   Musculoskeletal:  Negative for joint pain and myalgias.  Skin: Negative.   Neurological:  Positive for headaches. Negative for dizziness.  Endo/Heme/Allergies: Negative.   All other systems reviewed and are negative.      Objective:   BP 129/78   Pulse 74   Ht 6\' 2"  (1.88 m)   Wt 253 lb 12.8 oz (115.1 kg)   SpO2 99%   BMI 32.59 kg/m   Vitals:   04/22/23 1341  BP: 129/78  Pulse: 74  Height: 6\' 2"  (1.88 m)  Weight: 253 lb 12.8 oz (115.1 kg)  SpO2: 99%  BMI (Calculated): 32.57    Physical Exam Nursing note reviewed.  Constitutional:      Appearance: Normal appearance. He is normal weight.  HENT:     Head: Normocephalic and atraumatic.     Nose: Nose normal.     Mouth/Throat:     Mouth: Mucous membranes are moist.     Pharynx: Oropharynx is clear.  Eyes:     Extraocular Movements: Extraocular movements intact.     Conjunctiva/sclera: Conjunctivae normal.     Pupils: Pupils are equal, round, and reactive to light.  Cardiovascular:     Rate and Rhythm: Normal rate and regular rhythm.      Pulses: Normal pulses.     Heart sounds: Normal heart sounds.  Pulmonary:     Effort: Pulmonary effort is normal.     Breath sounds: Normal breath sounds.  Abdominal:     General: Abdomen is flat. Bowel sounds are normal.     Palpations: Abdomen is soft.  Musculoskeletal:        General: Normal range of motion.     Cervical back: Normal range of motion.  Skin:    General: Skin is warm and dry.  Neurological:     General: No focal deficit present.     Mental Status: He is alert and oriented to person, place, and time.  Psychiatric:        Mood and Affect: Mood normal.        Behavior: Behavior normal.        Thought Content: Thought content normal.        Judgment: Judgment normal.      No results found for any visits on 04/22/23.  No results found for this or any previous visit (from the past 2160 hour(s)).    Assessment & Plan:  Antibiotic, tessalon pearls, and albuterol sent to the pharmacy Continue to use an antihistamine, nasal spray and Mucinex.  Problem List Items Addressed This Visit       Respiratory   Acute URI - Primary    Return if symptoms worsen or fail to improve.   Total time spent: 25 minutes  [employer], NP  04/22/2023   This document may have been prepared by Dragon Voice Recognition software and as such may include unintentional dictation errors.

## 2023-05-04 ENCOUNTER — Other Ambulatory Visit: Payer: Self-pay

## 2023-05-05 MED ORDER — PANTOPRAZOLE SODIUM 40 MG PO TBEC: 40.0000 mg | DELAYED_RELEASE_TABLET | Freq: Every day | ORAL | 3 refills | Status: DC | PRN

## 2023-05-10 ENCOUNTER — Ambulatory Visit: Payer: Medicaid Other | Admitting: Dermatology

## 2023-06-01 ENCOUNTER — Encounter: Payer: Self-pay | Admitting: Cardiology

## 2023-06-01 ENCOUNTER — Ambulatory Visit (INDEPENDENT_AMBULATORY_CARE_PROVIDER_SITE_OTHER): Payer: Medicaid Other | Admitting: Dermatology

## 2023-06-01 ENCOUNTER — Ambulatory Visit: Payer: Medicaid Other | Admitting: Cardiology

## 2023-06-01 VITALS — BP 122/80 | HR 82 | Ht 74.0 in | Wt 264.0 lb

## 2023-06-01 DIAGNOSIS — M79601 Pain in right arm: Secondary | ICD-10-CM | POA: Diagnosis not present

## 2023-06-01 DIAGNOSIS — M25531 Pain in right wrist: Secondary | ICD-10-CM | POA: Insufficient documentation

## 2023-06-01 DIAGNOSIS — L700 Acne vulgaris: Secondary | ICD-10-CM

## 2023-06-01 DIAGNOSIS — Z0130 Encounter for examination of blood pressure without abnormal findings: Secondary | ICD-10-CM

## 2023-06-01 DIAGNOSIS — Z7189 Other specified counseling: Secondary | ICD-10-CM

## 2023-06-01 DIAGNOSIS — Z79899 Other long term (current) drug therapy: Secondary | ICD-10-CM

## 2023-06-01 DIAGNOSIS — M25562 Pain in left knee: Secondary | ICD-10-CM | POA: Insufficient documentation

## 2023-06-01 MED ORDER — ADAPALENE 0.3 % EX GEL: CUTANEOUS | 4 refills | Status: DC

## 2023-06-01 MED ORDER — DOXYCYCLINE MONOHYDRATE 100 MG PO CAPS: ORAL_CAPSULE | ORAL | 3 refills | Status: DC

## 2023-06-01 NOTE — Progress Notes (Signed)
Established Patient Office Visit  Subjective:  Patient ID: Derek Holland, male    DOB: 10/20/2000  Age: 22 y.o. MRN: [id number]  Chief Complaint  Patient presents with   Acute Visit    Patient in office complaining of left knee pain with bending. Patient states pain started about 4-5 months ago while playing basketball. Patient reports pain only happens when he bends it. Will order an xray of the knee. Patient also complaining of right arm pain. Patient reports pain occurs in his veins when he touches his arm. Patient states he had injuries to the same arm as a child. Will order a venous doppler.   Knee Pain  The incident occurred more than 1 week ago. The incident occurred at the gym. The injury mechanism was a twisting injury. The pain is present in the left knee. The quality of the pain is described as aching. The pain is at a severity of 5/10. The pain is severe (with bending). The pain has been Intermittent since onset. Pertinent negatives include no numbness or tingling. He reports no foreign bodies present. The symptoms are aggravated by movement. He has tried acetaminophen, NSAIDs and rest for the symptoms. The treatment provided no relief.    No other concerns at this time.   Past Medical History:  Diagnosis Date   Eczema    Urticaria     Past Surgical History:  Procedure Laterality Date   COLONOSCOPY WITH PROPOFOL N/A 03/27/2021   Procedure: COLONOSCOPY WITH PROPOFOL;  Surgeon: Pasty Spillers, MD;  Location: [facility] ENDOSCOPY;  Service: Endoscopy;  Laterality: N/A;   ESOPHAGOGASTRODUODENOSCOPY N/A 03/27/2021   Procedure: ESOPHAGOGASTRODUODENOSCOPY (EGD);  Surgeon: Pasty Spillers, MD;  Location: [facility] ENDOSCOPY;  Service: Endoscopy;  Laterality: N/A;   WISDOM TOOTH EXTRACTION      Social History   Socioeconomic History   Marital status: Single    Spouse name: Not on file   Number of children: Not on file   Years of education: Not on file   Highest education  level: Not on file  Occupational History   Not on file  Tobacco Use   Smoking status: Never    Passive exposure: Never   Smokeless tobacco: Never  Vaping Use   Vaping status: Never Used  Substance and Sexual Activity   Alcohol use: Never   Drug use: Never   Sexual activity: Not on file  Other Topics Concern   Not on file  Social History Narrative   Not on file   Social Determinants of Health   Financial Resource Strain: Not on file  Food Insecurity: Not on file  Transportation Needs: Not on file  Physical Activity: Not on file  Stress: Not on file  Social Connections: Not on file  Intimate Partner Violence: Not on file    History reviewed. No pertinent family history.  No Known Allergies  Outpatient Medications Prior to Visit  Medication Sig   albuterol (VENTOLIN HFA) 108 (90 Base) MCG/ACT inhaler Inhale 2 puffs into the lungs every 6 (six) hours as needed for wheezing or shortness of breath.   benzonatate (TESSALON PERLES) 100 MG capsule Take 1 capsule (100 mg total) by mouth 3 (three) times daily as needed for cough.   cetirizine (ZYRTEC) 10 MG tablet TAKE 1 TABLET(10 MG) BY MOUTH DAILY   EPIPEN 2-PAK 0.3 MG/0.3ML SOAJ injection Use as directed for life threatening allergic reactions   famotidine (PEPCID) 20 MG tablet Take 1 tablet (20 mg total) by mouth daily.  fluticasone (FLONASE) 50 MCG/ACT nasal spray Place 2 sprays into both nostrils daily.   magnesium 30 MG tablet Take 30 mg by mouth daily.   pantoprazole (PROTONIX) 40 MG tablet Take 1 tablet (40 mg total) by mouth daily as needed.   No facility-administered medications prior to visit.    Review of Systems  Constitutional: Negative.   HENT: Negative.    Eyes: Negative.   Respiratory: Negative.  Negative for shortness of breath.   Cardiovascular: Negative.  Negative for chest pain.  Gastrointestinal: Negative.  Negative for abdominal pain, constipation and diarrhea.  Genitourinary: Negative.    Musculoskeletal:  Positive for joint pain. Negative for myalgias.  Skin: Negative.   Neurological: Negative.  Negative for dizziness, tingling, numbness and headaches.  Endo/Heme/Allergies: Negative.   All other systems reviewed and are negative.      Objective:   BP 122/80   Pulse 82   Ht 6\' 2"  (1.88 m)   Wt 264 lb (119.7 kg)   SpO2 99%   BMI 33.90 kg/m   Vitals:   06/01/23 1025  BP: 122/80  Pulse: 82  Height: 6\' 2"  (1.88 m)  Weight: 264 lb (119.7 kg)  SpO2: 99%  BMI (Calculated): 33.88    Physical Exam Nursing note reviewed.  Constitutional:      Appearance: Normal appearance. He is normal weight.  HENT:     Head: Normocephalic and atraumatic.     Nose: Nose normal.     Mouth/Throat:     Mouth: Mucous membranes are moist.     Pharynx: Oropharynx is clear.  Eyes:     Extraocular Movements: Extraocular movements intact.     Conjunctiva/sclera: Conjunctivae normal.     Pupils: Pupils are equal, round, and reactive to light.  Cardiovascular:     Rate and Rhythm: Normal rate and regular rhythm.     Pulses: Normal pulses.     Heart sounds: Normal heart sounds.  Pulmonary:     Effort: Pulmonary effort is normal.     Breath sounds: Normal breath sounds.  Abdominal:     General: Abdomen is flat. Bowel sounds are normal.     Palpations: Abdomen is soft.  Musculoskeletal:        General: Normal range of motion.     Cervical back: Normal range of motion.  Skin:    General: Skin is warm and dry.  Neurological:     General: No focal deficit present.     Mental Status: He is alert and oriented to person, place, and time.  Psychiatric:        Mood and Affect: Mood normal.        Behavior: Behavior normal.        Thought Content: Thought content normal.        Judgment: Judgment normal.      No results found for any visits on 06/01/23.  No results found for this or any previous visit (from the past 2160 hour(s)).    Assessment & Plan:  Xray left  knee Ultrasound of the right arm  Problem List Items Addressed This Visit       Other   Acute pain of left knee - Primary   Relevant Orders   DG Knee Complete 4 Views Left   Right arm pain   Relevant Orders   VAS [place] LOWER EXTREMITY VENOUS (DVT)    Return in about 4 weeks (around 06/29/2023).   Total time spent: 25 minutes  Marisue Ivan, NP  06/01/2023   This document may have been prepared by [employer] Voice Recognition software and as such may include unintentional dictation errors.

## 2023-06-01 NOTE — Progress Notes (Signed)
   New Patient Visit   Subjective  Derek Holland is a 22 y.o. male who presents for the following: Acne Vulgaris  Patient reports he has breakouts usually on face. Currently using over the counter acne face wash.   mother is with patient and contributes to history.   The following portions of the chart were reviewed this encounter and updated as appropriate: medications, allergies, medical history  Review of Systems:  No other skin or systemic complaints except as noted in HPI or Assessment and Plan.  Objective  Well appearing patient in no apparent distress; mood and affect are within normal limits.  Areas Examined: Face, chest and back  Relevant exam findings are noted in the Assessment and Plan.   Assessment & Plan   Acne vulgaris  Related Medications Adapalene (DIFFERIN) 0.3 % gel Apply pea sized amount to face nightly as tolerated  doxycycline (MONODOX) 100 MG capsule Take 1 tab by mouth daily with food and drink for acne   ACNE VULGARIS Exam: small spotty hyperpigmentation and comedones on back and papules on face   Chronic and persistent condition with duration or expected duration over one year. Condition is bothersome/symptomatic for patient. Currently flared.   Treatment Plan: Start doxycycline 100 mg tab - take 1 tab po by mouth daily with evening meal.   Doxycycline should be taken with food to prevent nausea. Do not lay down for 30 minutes after taking. Be cautious with sun exposure and use good sun protection while on this medication. Pregnant women should not take this medication.   Start adapalene 0.3 gel apply pea sized amount to face nightly M-W-F as tolerated.  3 refills  Topical retinoid medications like tretinoin/Retin-A, adapalene/Differin, tazarotene/Fabior, and Epiduo/Epiduo Forte can cause dryness and irritation when first started. Only apply a pea-sized amount to the entire affected area. Avoid applying it around the eyes, edges of mouth and  creases at the nose. If you experience irritation, use a good moisturizer first and/or apply the medicine less often. If you are doing well with the medicine, you can increase how often you use it until you are applying every night. Be careful with sun protection while using this medication as it can make you sensitive to the sun. This medicine should not be used by pregnant women.    Will consider other treatment if not improving at follow up.    Return in about 4 months (around 09/30/2023) for acne follow up.  IAsher Muir, CMA, am acting as scribe for Armida Sans, MD.   Documentation: I have reviewed the above documentation for accuracy and completeness, and I agree with the above.  Armida Sans, MD

## 2023-06-01 NOTE — Patient Instructions (Addendum)
Use mild or gentle soap for face   Start doxycycline 100 mg tab take 1 by mouth daily with evening meal.   Doxycycline should be taken with food to prevent nausea. Do not lay down for 30 minutes after taking. Be cautious with sun exposure and use good sun protection while on this medication. Pregnant women should not take this medication.   Start adapalene 0.3 gel apply pea sized amount to face nightly 3 night weekly on Monday Wednesday and Friday as tolerated.  Topical retinoid medications like tretinoin/Retin-A, adapalene/Differin, tazarotene/Fabior, and Epiduo/Epiduo Forte can cause dryness and irritation when first started. Only apply a pea-sized amount to the entire affected area. Avoid applying it around the eyes, edges of mouth and creases at the nose. If you experience irritation, use a good moisturizer first and/or apply the medicine less often. If you are doing well with the medicine, you can increase how often you use it until you are applying every night. Be careful with sun protection while using this medication as it can make you sensitive to the sun. This medicine should not be used by pregnant women.     Due to recent changes in healthcare laws, you may see results of your pathology and/or laboratory studies on MyChart before the doctors have had a chance to review them. We understand that in some cases there may be results that are confusing or concerning to you. Please understand that not all results are received at the same time and often the doctors may need to interpret multiple results in order to provide you with the best plan of care or course of treatment. Therefore, we ask that you please give [place] 2 business days to thoroughly review all your results before contacting the office for clarification. Should we see a critical lab result, you will be contacted sooner.   If You Need Anything After Your Visit  If you have any questions or concerns for your doctor, please call our  main line at [phone] and press option 4 to reach your doctor's medical assistant. If no one answers, please leave a voicemail as directed and we will return your call as soon as possible. Messages left after 4 pm will be answered the following business day.   You may also send [place] a message via MyChart. We typically respond to MyChart messages within 1-2 business days.  For prescription refills, please ask your pharmacy to contact our office. Our fax number is [phone].  If you have an urgent issue when the clinic is closed that cannot wait until the next business day, you can page your doctor at the number below.    Please note that while we do our best to be available for urgent issues outside of office hours, we are not available 24/7.   If you have an urgent issue and are unable to reach [place], you may choose to seek medical care at your doctor's office, retail clinic, urgent care center, or emergency room.  If you have a medical emergency, please immediately call 911 or go to the emergency department.  Pager Numbers  - Dr. Gwen Pounds: [phone]  - Dr. Roseanne Reno: [phone]  - Dr. Katrinka Blazing: [phone]   In the event of inclement weather, please call our main line at [phone] for an update on the status of any delays or closures.  Dermatology Medication Tips: Please keep the boxes that topical medications come in in order to help keep track of the instructions about where and how to use  these. Pharmacies typically print the medication instructions only on the boxes and not directly on the medication tubes.   If your medication is too expensive, please contact our office at [phone] option 4 or send [place] a message through MyChart.   We are unable to tell what your co-pay for medications will be in advance as this is different depending on your insurance coverage. However, we may be able to find a substitute medication at lower cost or fill out paperwork to get insurance to  cover a needed medication.   If a prior authorization is required to get your medication covered by your insurance company, please allow [place] 1-2 business days to complete this process.  Drug prices often vary depending on where the prescription is filled and some pharmacies may offer cheaper prices.  The website www.goodrx.com contains coupons for medications through different pharmacies. The prices here do not account for what the cost may be with help from insurance (it may be cheaper with your insurance), but the website can give you the price if you did not use any insurance.  - You can print the associated coupon and take it with your prescription to the pharmacy.  - You may also stop by our office during regular business hours and pick up a GoodRx coupon card.  - If you need your prescription sent electronically to a different pharmacy, notify our office through [facility] or by phone at [phone] option 4.     Si Usted Necesita Algo Despus de Su Visita  Tambin puede enviarnos un mensaje a travs de Clinical cytogeneticist. Por lo general respondemos a los mensajes de MyChart en el transcurso de 1 a 2 das hbiles.  Para renovar recetas, por favor pida a su farmacia que se ponga en contacto con nuestra oficina. Annie Sable de fax es [place] [phone].  Si tiene un asunto urgente cuando la clnica est cerrada y que no puede esperar hasta el siguiente da hbil, puede llamar/localizar a su doctor(a) al nmero que aparece a continuacin.   Por favor, tenga en cuenta que aunque hacemos todo lo posible para estar disponibles para asuntos urgentes fuera del horario de [place], no estamos disponibles las 24 horas del da, los 7 [address]  [address] de la [place].   Si tiene un problema urgente y no puede comunicarse con nosotros, puede optar por buscar atencin mdica  en el consultorio de su doctor(a), en una clnica privada, en un centro de atencin urgente o en una sala de emergencias.  Si tiene Wellsite geologist, por favor llame inmediatamente al 911 o vaya a la sala de emergencias.  Nmeros de bper  - Dr. Gwen Pounds: [phone]  - Dra. Roseanne Reno: [phone]  - Dr. Katrinka Blazing: [phone]   En caso de inclemencias del tiempo, por favor llame a Lacy Duverney principal al [phone] para una actualizacin sobre el [place] de cualquier retraso o cierre.  Consejos para la medicacin en dermatologa: Por favor, guarde las cajas en las que vienen los medicamentos de uso tpico para ayudarle a seguir las instrucciones sobre dnde y cmo usarlos. Las farmacias generalmente imprimen las instrucciones del medicamento slo en las cajas y no directamente en los tubos del [place].   Si su medicamento es muy caro, por favor, pngase en contacto con Rolm Gala llamando al [phone] y presione la opcin 4 o envenos un mensaje a travs de Clinical cytogeneticist.   No podemos decirle cul ser su copago por los medicamentos por adelantado ya que esto es diferente dependiendo de  la cobertura de su seguro. Sin embargo, es posible que podamos encontrar un medicamento sustituto a Audiological scientist un formulario para que el seguro cubra el medicamento que se considera necesario.   Si se requiere una autorizacin previa para que su compaa de seguros [place] su medicamento, por favor permtanos de 1 a 2 das hbiles para completar [address]  Los precios de los medicamentos varan con frecuencia dependiendo del Environmental consultant de dnde se surte la receta y alguna farmacias pueden ofrecer precios ms baratos.  El sitio web www.goodrx.com tiene cupones para medicamentos de Health and safety inspector. Los precios aqu no tienen en cuenta lo que podra costar con la ayuda del seguro (puede ser ms barato con su seguro), pero el sitio web puede darle el precio si no utiliz Tourist information centre manager.  - Puede imprimir el cupn correspondiente y llevarlo con su receta a la farmacia.  - Tambin puede pasar por nuestra oficina durante el  horario de atencin regular y Education officer, museum una tarjeta de cupones de GoodRx.  - Si necesita que su receta se enve electrnicamente a una farmacia diferente, informe a nuestra oficina a travs de MyChart de [place] o por telfono llamando al [phone] y presione la opcin 4.

## 2023-06-07 ENCOUNTER — Encounter: Payer: Self-pay | Admitting: Dermatology

## 2023-06-08 ENCOUNTER — Ambulatory Visit: Payer: Medicaid Other

## 2023-06-09 ENCOUNTER — Encounter: Payer: Self-pay | Admitting: Cardiology

## 2023-06-09 ENCOUNTER — Ambulatory Visit: Payer: Medicaid Other | Admitting: Cardiology

## 2023-06-09 ENCOUNTER — Other Ambulatory Visit: Payer: Self-pay | Admitting: Cardiology

## 2023-06-09 DIAGNOSIS — M79601 Pain in right arm: Secondary | ICD-10-CM

## 2023-06-15 ENCOUNTER — Other Ambulatory Visit: Payer: Medicaid Other

## 2023-06-15 DIAGNOSIS — M79601 Pain in right arm: Secondary | ICD-10-CM | POA: Diagnosis not present

## 2023-07-01 ENCOUNTER — Ambulatory Visit: Payer: Medicaid Other | Admitting: Cardiology

## 2023-07-04 ENCOUNTER — Encounter: Payer: Self-pay | Admitting: Cardiology

## 2023-07-04 ENCOUNTER — Ambulatory Visit: Payer: Medicaid Other | Admitting: Cardiology

## 2023-07-04 VITALS — BP 148/70 | HR 80 | Ht 74.0 in | Wt 270.0 lb

## 2023-07-04 DIAGNOSIS — M79601 Pain in right arm: Secondary | ICD-10-CM | POA: Diagnosis not present

## 2023-07-04 DIAGNOSIS — M25562 Pain in left knee: Secondary | ICD-10-CM | POA: Diagnosis not present

## 2023-07-04 DIAGNOSIS — Z0130 Encounter for examination of blood pressure without abnormal findings: Secondary | ICD-10-CM

## 2023-07-04 NOTE — Progress Notes (Signed)
 Established Patient Office Visit  Subjective:  Patient ID: Derek Holland, male    DOB: 2000/07/18  Age: 23 y.o. MRN: [id number]  Chief Complaint  Patient presents with   Follow-up    1 Month Follow Up    Patient in office for 1 month follow up. Patient seen and evaluated at [facility] for knee pain. Continue follow up with them.  Right arm ultrasound normal. Pain on palpation likely nerve pain from injury as a child.     No other concerns at this time.   Past Medical History:  Diagnosis Date   Eczema    Urticaria     Past Surgical History:  Procedure Laterality Date   COLONOSCOPY WITH PROPOFOL  N/A 03/27/2021   Procedure: COLONOSCOPY WITH PROPOFOL ;  Surgeon: Janalyn Keene NOVAK, MD;  Location: [facility] ENDOSCOPY;  Service: Endoscopy;  Laterality: N/A;   ESOPHAGOGASTRODUODENOSCOPY N/A 03/27/2021   Procedure: ESOPHAGOGASTRODUODENOSCOPY (EGD);  Surgeon: Janalyn Keene NOVAK, MD;  Location[facility] ENDOSCOPY;  Service: Endoscopy;  Laterality: N/A;   WISDOM TOOTH EXTRACTION      Social History   Socioeconomic History   Marital status: Single    Spouse name: Not on file   Number of children: Not on file   Years of education: Not on file   Highest education level: Not on file  Occupational History   Not on file  Tobacco Use   Smoking status: Never    Passive exposure: Never   Smokeless tobacco: Never  Vaping Use   Vaping status: Never Used  Substance and Sexual Activity   Alcohol use: Never   Drug use: Never   Sexual activity: Not on file  Other Topics Concern   Not on file  Social History Narrative   Not on file   Social Drivers of Health   Financial Resource Strain: Not on file  Food Insecurity: Not on file  Transportation Needs: Not on file  Physical Activity: Not on file  Stress: Not on file  Social Connections: Not on file  Intimate Partner Violence: Not on file    History reviewed. No pertinent family history.  No Known Allergies  Outpatient  Medications Prior to Visit  Medication Sig   Adapalene  (DIFFERIN ) 0.3 % gel Apply pea sized amount to face nightly as tolerated   albuterol  (VENTOLIN  HFA) 108 (90 Base) MCG/ACT inhaler Inhale 2 puffs into the lungs every 6 (six) hours as needed for wheezing or shortness of breath.   cetirizine  (ZYRTEC ) 10 MG tablet TAKE 1 TABLET(10 MG) BY MOUTH DAILY   EPIPEN  2-PAK 0.3 MG/0.3ML SOAJ injection Use as directed for life threatening allergic reactions   famotidine  (PEPCID ) 20 MG tablet Take 1 tablet (20 mg total) by mouth daily.   fluticasone  (FLONASE ) 50 MCG/ACT nasal spray Place 2 sprays into both nostrils daily.   magnesium 30 MG tablet Take 30 mg by mouth daily.   pantoprazole  (PROTONIX ) 40 MG tablet Take 1 tablet (40 mg total) by mouth daily as needed.   [DISCONTINUED] benzonatate  (TESSALON  PERLES) 100 MG capsule Take 1 capsule (100 mg total) by mouth 3 (three) times daily as needed for cough.   [DISCONTINUED] doxycycline  (MONODOX ) 100 MG capsule Take 1 tab by mouth daily with food and drink for acne   No facility-administered medications prior to visit.    Review of Systems  Constitutional: Negative.   HENT: Negative.    Eyes: Negative.   Respiratory: Negative.  Negative for shortness of breath.   Cardiovascular: Negative.  Negative for  chest pain.  Gastrointestinal: Negative.  Negative for abdominal pain, constipation and diarrhea.  Genitourinary: Negative.   Musculoskeletal:  Negative for joint pain and myalgias.  Skin: Negative.   Neurological: Negative.  Negative for dizziness and headaches.  Endo/Heme/Allergies: Negative.   All other systems reviewed and are negative.      Objective:   BP (!) 148/70   Pulse 80   Ht 6' 2 (1.88 m)   Wt 270 lb (122.5 kg)   SpO2 98%   BMI 34.67 kg/m   Vitals:   07/04/23 0941  BP: (!) 148/70  Pulse: 80  Height: 6' 2 (1.88 m)  Weight: 270 lb (122.5 kg)  SpO2: 98%  BMI (Calculated): 34.65    Physical Exam Vitals and nursing  note reviewed.  Constitutional:      Appearance: Normal appearance. He is normal weight.  HENT:     Head: Normocephalic and atraumatic.     Nose: Nose normal.     Mouth/Throat:     Mouth: Mucous membranes are moist.     Pharynx: Oropharynx is clear.  Eyes:     Extraocular Movements: Extraocular movements intact.     Conjunctiva/sclera: Conjunctivae normal.     Pupils: Pupils are equal, round, and reactive to light.  Cardiovascular:     Rate and Rhythm: Normal rate and regular rhythm.     Pulses: Normal pulses.     Heart sounds: Normal heart sounds.  Pulmonary:     Effort: Pulmonary effort is normal.     Breath sounds: Normal breath sounds.  Abdominal:     General: Abdomen is flat. Bowel sounds are normal.     Palpations: Abdomen is soft.  Musculoskeletal:        General: Normal range of motion.     Cervical back: Normal range of motion.  Skin:    General: Skin is warm and dry.  Neurological:     General: No focal deficit present.     Mental Status: He is alert and oriented to person, place, and time.  Psychiatric:        Mood and Affect: Mood normal.        Behavior: Behavior normal.        Thought Content: Thought content normal.        Judgment: Judgment normal.      No results found for any visits on 07/04/23.  No results found for this or any previous visit (from the past 2160 hours).    Assessment & Plan:  Follow up with [facility] for knee pain.   Problem List Items Addressed This Visit       Other   Acute pain of left knee   Right arm pain - Primary    Return in about 6 months (around 01/01/2024).   Total time spent: 25 minutes  [employer], NP  07/04/2023   This document may have been prepared by Dragon Voice Recognition software and as such may include unintentional dictation errors.

## 2023-09-26 ENCOUNTER — Telehealth: Payer: Self-pay | Admitting: Allergy

## 2023-09-26 NOTE — Telephone Encounter (Signed)
 Patients mom called and stated that she called the pharmacy for a refill on his famotidine but pharmacy told her to call here because they need auth for this prescription. Patients pharmacy is [facility] in [place] on [address]. Patients call back number is [phone].

## 2023-09-26 NOTE — Telephone Encounter (Signed)
 Can we please initiate a Prior Authorization for Famotidine? I called and left a voicemail for the patient's mother asking for a return call to discuss.

## 2023-09-27 ENCOUNTER — Other Ambulatory Visit (HOSPITAL_COMMUNITY): Payer: Self-pay

## 2023-09-27 ENCOUNTER — Telehealth: Payer: Self-pay

## 2023-09-27 ENCOUNTER — Other Ambulatory Visit: Payer: Self-pay | Admitting: Allergy

## 2023-09-27 NOTE — Telephone Encounter (Signed)
 Per test claim with [facility] pharmacies PA is not needed and copay is $4.00

## 2023-09-27 NOTE — Telephone Encounter (Signed)
*  Asthma/Allergy  Pharmacy Patient Advocate Encounter   Received notification from Pt Calls Messages that prior authorization for Famotidine 20mg   is required/requested.   Insurance verification completed.   The patient is insured through  Managed Medicaid/[employer]   .   Per test claim: The current 30 day co-pay is, $4.00.  No PA needed at this time. This test claim was processed through [facility]- copay amounts may vary at other pharmacies due to pharmacy/plan contracts, or as the patient moves through the different stages of their insurance plan.

## 2023-09-27 NOTE — Telephone Encounter (Signed)
 Thank You I appreciate.

## 2023-09-27 NOTE — Telephone Encounter (Signed)
 Called and l/m for patients pharmacy to reprocess

## 2023-09-27 NOTE — Telephone Encounter (Signed)
 PA should still not be needed. I will call the pharmacy

## 2023-09-27 NOTE — Telephone Encounter (Signed)
 Will need to still proceed with the PA, patient gets his prescriptions from [facility], not [facility].

## 2023-09-28 ENCOUNTER — Other Ambulatory Visit: Payer: Self-pay | Admitting: *Deleted

## 2023-09-28 NOTE — Telephone Encounter (Signed)
 I left a v/m for the pharmacy-they have not called me back with any questions or issues so far.

## 2023-09-28 NOTE — Telephone Encounter (Signed)
 I called and checked with the pharmacy, the prescription is ready for pick up and will be $4.00. I called and let a voicemail asking for patient's mother tor return call to inform.

## 2023-11-09 ENCOUNTER — Ambulatory Visit: Payer: Medicaid Other | Admitting: Dermatology

## 2023-11-24 ENCOUNTER — Ambulatory Visit (INDEPENDENT_AMBULATORY_CARE_PROVIDER_SITE_OTHER): Payer: Medicaid Other | Admitting: Allergy

## 2023-11-24 ENCOUNTER — Encounter: Payer: Self-pay | Admitting: Allergy

## 2023-11-24 ENCOUNTER — Other Ambulatory Visit: Payer: Self-pay | Admitting: Dermatology

## 2023-11-24 VITALS — BP 132/92 | HR 83 | Temp 99.1°F | Resp 16 | Ht 72.84 in | Wt 273.0 lb

## 2023-11-24 DIAGNOSIS — J3089 Other allergic rhinitis: Secondary | ICD-10-CM

## 2023-11-24 DIAGNOSIS — T781XXD Other adverse food reactions, not elsewhere classified, subsequent encounter: Secondary | ICD-10-CM | POA: Diagnosis not present

## 2023-11-24 DIAGNOSIS — L508 Other urticaria: Secondary | ICD-10-CM

## 2023-11-24 DIAGNOSIS — L700 Acne vulgaris: Secondary | ICD-10-CM

## 2023-11-24 MED ORDER — FLUTICASONE PROPIONATE 50 MCG/ACT NA SUSP
2.0000 | Freq: Two times a day (BID) | NASAL | 5 refills | Status: DC | PRN
Start: 2023-11-24 — End: 2024-03-19

## 2023-11-24 MED ORDER — FAMOTIDINE 20 MG PO TABS: 20.0000 mg | ORAL_TABLET | Freq: Every day | ORAL | 2 refills | Status: DC

## 2023-11-24 MED ORDER — CETIRIZINE HCL 10 MG PO TABS: 10.0000 mg | ORAL_TABLET | Freq: Every day | ORAL | 2 refills | Status: DC

## 2023-11-24 NOTE — Progress Notes (Signed)
 Follow-up Note  RE: Derek Holland MRN: [id number] DOB: 2001-02-03 Date of Office Visit: 11/24/2023   History of present illness: Derek Holland is a 23 y.o. male presenting today for follow-up of urticaria, food allergy, allergic rhinitis.  He was last seen in the office on 03/24/2023 by myself.   Discussed the use of AI scribe software for clinical note transcription with the patient, who gave verbal consent to proceed.  He has a history of chronic hives, which are well-controlled with a regimen of Zyrtec  and Pepcid  taken once daily at night. No occurrence of hives has been noted since the last visit as long as he continues on the antihistamines. An attempt to reduce medication intake resulted in a recurrence of hives after three days without medication.  He has environmental allergies, including mold, dust mites, tree pollen, dog, and wheat pollen, leading to daily nasal stuffiness. He has not consistently used nasal sprays, typically only for a couple of days, and has not used them for a full week.  He avoids nuts due to allergies and consumes dairy in moderation. He has not needed to use his EpiPen  since the last visit in September.  No other allergy symptoms such as runny nose, itchy or watery eyes, and sneezing are present.     Review of systems: 10pt ROS negative unless noted above in HPI   Past medical/social/surgical/family history have been reviewed and are unchanged unless specifically indicated below.  No changes  Medication List: Current Outpatient Medications  Medication Sig Dispense Refill   Adapalene  (DIFFERIN ) 0.3 % gel Apply pea sized amount to face nightly as tolerated 45 g 4   albuterol  (VENTOLIN  HFA) 108 (90 Base) MCG/ACT inhaler Inhale 2 puffs into the lungs every 6 (six) hours as needed for wheezing or shortness of breath. 8 g 2   celecoxib (CELEBREX) 200 MG capsule Take 200 mg by mouth daily.     cetirizine  (ZYRTEC ) 10 MG tablet TAKE 1 TABLET(10 MG) BY MOUTH  DAILY 90 tablet 2   doxycycline  (MONODOX ) 100 MG capsule TAKE 1 CAPSULE BY MOUTH DAILY WITH FOOD 30 capsule 3   EPIPEN  2-PAK 0.3 MG/0.3ML SOAJ injection Use as directed for life threatening allergic reactions 4 each 1   famotidine  (PEPCID ) 20 MG tablet TAKE 1 TABLET(20 MG) BY MOUTH DAILY 30 tablet 5   fluticasone  (FLONASE ) 50 MCG/ACT nasal spray Place 2 sprays into both nostrils daily. 16 g 5   magnesium 30 MG tablet Take 30 mg by mouth daily.     pantoprazole  (PROTONIX ) 40 MG tablet Take 1 tablet (40 mg total) by mouth daily as needed. 90 tablet 3   No current facility-administered medications for this visit.     Known medication allergies: No Known Allergies   Physical examination: Blood pressure (!) 132/92, pulse 83, temperature 99.1 F (37.3 C), temperature source Temporal, resp. rate 16, height 6' 0.84" (1.85 m), weight 273 lb (123.8 kg), SpO2 99%.  General: Alert, interactive, in no acute distress. HEENT: PERRLA, TMs pearly gray, turbinates moderately edematous with clear discharge, post-pharynx non erythematous. Neck: Supple without lymphadenopathy. Lungs: Clear to auscultation without wheezing, rhonchi or rales. {no increased work of breathing. CV: Normal S1, S2 without murmurs. Abdomen: Nondistended, nontender. Skin: Warm and dry, without lesions or rashes. Extremities:  No clubbing, cyanosis or edema. Neuro:   Grossly intact.  Diagnostics/Labs: None today  Assessment and plan: Adverse food reaction - continue avoidance of nuts, dairy (in moderation) in the diet. - have  access to self-injectable epinephrine  (Epipen  or AuviQ) 0.3mg  at all times. - follow emergency action plan in case of allergic reaction. Discussed today there is a new nasal epinephrine  spray (Neffy) option available for allergic reaction treatment.  Let me know if you are interested in having this option.   - we have previously discussed Xolair injections as a newer FDA indicated medication for food  allergy management if we have accidental ingestions or reactions frequently  - we have previously discussed the following in regards to foods:   Allergy: food allergy is when you have eaten a food, developed an allergic reaction after eating the food and have IgE to the food (positive food testing either by skin testing or blood testing).  Food allergy could lead to life threatening symptoms  Sensitivity: occurs when you have IgE to a food (positive food testing either by skin testing or blood testing) but is a food you eat without any issues.  This is not an allergy and we recommend keeping the food in the diet  Intolerance: this is when you have negative testing by either skin testing or blood testing thus not allergic but the food causes symptoms (like belly pain, bloating, diarrhea etc) with ingestion.  These foods should be avoided to prevent symptoms.    Chronic hives  - at this time etiology of hives and swelling is spontaneous in nature but increase in body temperature does flare hives.  Hives can be caused by a variety of different triggers including illness/infection, foods, medications, stings, exercise, pressure, vibrations, extremes of temperature to name a few however majority of the time there is no identifiable trigger.   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    Allergic rhinitis - continue avoidance measures for mold, dust mites, tree pollen, dog dander and weed pollen - Zyrtec  daily as above for symptom control - use Flonase  2 sprays each nostril daily for 1-2 weeks at a time before stopping once nasal congestion improves for maximum benefit   Follow-up in 6 months or sooner if needed I appreciate the opportunity to take part in Derek Holland's care. Please do not  hesitate to contact me with questions.  Sincerely,   Catha Clink, MD Allergy/Immunology [facility]

## 2023-11-24 NOTE — Patient Instructions (Addendum)
 Adverse food reaction - continue avoidance of nuts, dairy (in moderation) in the diet. - have access to self-injectable epinephrine  (Epipen  or AuviQ) 0.3mg  at all times. - follow emergency action plan in case of allergic reaction. Discussed today there is a new nasal epinephrine  spray (Neffy) option available for allergic reaction treatment.  Let me know if you are interested in having this option.   - we have previously discussed Xolair injections as a newer FDA indicated medication for food allergy management if we have accidental ingestions or reactions frequently  - we have previously discussed the following in regards to foods:   Allergy: food allergy is when you have eaten a food, developed an allergic reaction after eating the food and have IgE to the food (positive food testing either by skin testing or blood testing).  Food allergy could lead to life threatening symptoms  Sensitivity: occurs when you have IgE to a food (positive food testing either by skin testing or blood testing) but is a food you eat without any issues.  This is not an allergy and we recommend keeping the food in the diet  Intolerance: this is when you have negative testing by either skin testing or blood testing thus not allergic but the food causes symptoms (like belly pain, bloating, diarrhea etc) with ingestion.  These foods should be avoided to prevent symptoms.    Chronic hives  - at this time etiology of hives and swelling is spontaneous in nature but increase in body temperature does flare hives.  Hives can be caused by a variety of different triggers including illness/infection, foods, medications, stings, exercise, pressure, vibrations, extremes of temperature to name a few however majority of the time there is no identifiable trigger.   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    Environmental allergy - continue avoidance measures for mold, dust mites, tree pollen, dog dander and weed pollen - Zyrtec  daily as above for symptom control - use Flonase  2 sprays each nostril daily for 1-2 weeks at a time before stopping once nasal congestion improves for maximum benefit   Follow-up in 6 months or sooner if needed

## 2023-12-12 ENCOUNTER — Encounter: Payer: Self-pay | Admitting: Dermatology

## 2023-12-12 ENCOUNTER — Ambulatory Visit (INDEPENDENT_AMBULATORY_CARE_PROVIDER_SITE_OTHER): Admitting: Dermatology

## 2023-12-12 DIAGNOSIS — Z7189 Other specified counseling: Secondary | ICD-10-CM

## 2023-12-12 DIAGNOSIS — Z79899 Other long term (current) drug therapy: Secondary | ICD-10-CM

## 2023-12-12 DIAGNOSIS — Z792 Long term (current) use of antibiotics: Secondary | ICD-10-CM | POA: Diagnosis not present

## 2023-12-12 DIAGNOSIS — L700 Acne vulgaris: Secondary | ICD-10-CM

## 2023-12-12 MED ORDER — DOXYCYCLINE MONOHYDRATE 100 MG PO CAPS: ORAL_CAPSULE | ORAL | 5 refills | Status: DC

## 2023-12-12 MED ORDER — ADAPALENE 0.3 % EX GEL: CUTANEOUS | 5 refills | Status: AC

## 2023-12-12 NOTE — Progress Notes (Signed)
   Follow-Up Visit   Subjective  Derek Holland is a 23 y.o. male who presents for the following: 6 months f/u on acne taking Doxycycline  100 mg once a day and using Adapalene  gel with a good response The patient has spots, moles and lesions to be evaluated, some may be new or changing and the patient may have concern these could be cancer.  The following portions of the chart were reviewed this encounter and updated as appropriate: medications, allergies, medical history  Review of Systems:  No other skin or systemic complaints except as noted in HPI or Assessment and Plan.  Objective  Well appearing patient in no apparent distress; mood and affect are within normal limits.  A focused examination was performed of the following areas: face  Relevant exam findings are noted in the Assessment and Plan.   Assessment & Plan   ACNE VULGARIS Improved on current treatment Exam: tiny comedones on his forehead, face mainly clear  Chronic and persistent condition with duration or expected duration over one year. Condition is symptomatic/ bothersome to patient. Not currently at goal.  Treatment Plan: Continue Doxycycline  100 mg once a day, can try to decrease to 100 mg 3 days a week Continue Adapalene  gel at bedtime    ACNE VULGARIS   Related Medications Adapalene  (DIFFERIN ) 0.3 % gel Apply pea sized amount to face nightly as tolerated doxycycline  (MONODOX ) 100 MG capsule TAKE 1 CAPSULE BY MOUTH DAILY WITH FOOD  Return in about 7 months (around 07/13/2024) for Acne .  IClara Crisp, CMA, am acting as scribe for Celine Collard, MD .   Documentation: I have reviewed the above documentation for accuracy and completeness, and I agree with the above.  Celine Collard, MD

## 2023-12-12 NOTE — Patient Instructions (Signed)

## 2024-03-02 ENCOUNTER — Other Ambulatory Visit

## 2024-03-02 DIAGNOSIS — R5383 Other fatigue: Secondary | ICD-10-CM

## 2024-03-02 DIAGNOSIS — E782 Mixed hyperlipidemia: Secondary | ICD-10-CM

## 2024-03-02 DIAGNOSIS — R7303 Prediabetes: Secondary | ICD-10-CM

## 2024-03-03 LAB — LIPID PANEL
Chol/HDL Ratio: 2 ratio (ref 0.0–5.0)
Cholesterol, Total: 119 mg/dL (ref 100–199)
HDL: 61 mg/dL (ref >39)
LDL Chol Calc (NIH): 49 mg/dL (ref 0–99)
Triglycerides: 30 mg/dL (ref 0–149)
VLDL Cholesterol Cal: 9 mg/dL (ref 5–40)

## 2024-03-03 LAB — CMP14+EGFR
ALT: 23 IU/L (ref 0–44)
AST: 25 IU/L (ref 0–40)
Albumin: 4.4 g/dL (ref 4.3–5.2)
Alkaline Phosphatase: 61 IU/L (ref 44–121)
BUN/Creatinine Ratio: 10 (ref 9–20)
BUN: 13 mg/dL (ref 6–20)
Bilirubin Total: 0.4 mg/dL (ref 0.0–1.2)
CO2: 23 mmol/L (ref 20–29)
Calcium: 9.3 mg/dL (ref 8.7–10.2)
Chloride: 104 mmol/L (ref 96–106)
Creatinine, Ser: 1.33 mg/dL — ABNORMAL HIGH (ref 0.76–1.27)
Globulin, Total: 2.7 g/dL (ref 1.5–4.5)
Glucose: 85 mg/dL (ref 70–99)
Potassium: 4.1 mmol/L (ref 3.5–5.2)
Sodium: 142 mmol/L (ref 134–144)
Total Protein: 7.1 g/dL (ref 6.0–8.5)
eGFR: 78 mL/min/1.73 (ref >59)

## 2024-03-03 LAB — HEMOGLOBIN A1C
Est. average glucose Bld gHb Est-mCnc: 108 mg/dL
Hgb A1c MFr Bld: 5.4 % (ref 4.8–5.6)

## 2024-03-03 LAB — TSH: TSH: 0.755 u[IU]/mL (ref 0.450–4.500)

## 2024-03-05 ENCOUNTER — Encounter: Payer: Self-pay | Admitting: Cardiology

## 2024-03-05 ENCOUNTER — Ambulatory Visit (INDEPENDENT_AMBULATORY_CARE_PROVIDER_SITE_OTHER): Admitting: Cardiology

## 2024-03-05 ENCOUNTER — Ambulatory Visit: Payer: Self-pay | Admitting: Cardiology

## 2024-03-05 VITALS — BP 128/86 | HR 90 | Ht 72.0 in | Wt 265.0 lb

## 2024-03-05 DIAGNOSIS — Z0000 Encounter for general adult medical examination without abnormal findings: Secondary | ICD-10-CM | POA: Insufficient documentation

## 2024-03-05 NOTE — Progress Notes (Signed)
 Complete physical exam  Patient: Derek Holland   DOB: Feb 28, 2001   23 y.o. Male  MRN: [id number]  Subjective:    Chief Complaint  Patient presents with   Annual Exam    CPE with Lab Results    Derek Holland is a 23 y.o. male who presents today for a complete physical exam. He reports consuming a general diet. Gym/ health club routine includes cardio and mod to heavy weightlifting. He generally feels well. He reports sleeping well. He does not have additional problems to discuss today.    Most recent fall risk assessment:     No data to display           Most recent depression screenings:     No data to display          Vision:Not within last year  and Dental: No current dental problems and Receives regular dental care  Past Medical History:  Diagnosis Date   Eczema    Urticaria     Past Surgical History:  Procedure Laterality Date   COLONOSCOPY WITH PROPOFOL  N/A 03/27/2021   Procedure: COLONOSCOPY WITH PROPOFOL ;  Surgeon: Janalyn Keene NOVAK, MD;  Location: [facility] ENDOSCOPY;  Service: Endoscopy;  Laterality: N/A;   ESOPHAGOGASTRODUODENOSCOPY N/A 03/27/2021   Procedure: ESOPHAGOGASTRODUODENOSCOPY (EGD);  Surgeon: Janalyn Keene NOVAK, MD;  Location: [facility] ENDOSCOPY;  Service: Endoscopy;  Laterality: N/A;   WISDOM TOOTH EXTRACTION      History reviewed. No pertinent family history.  Social History   Socioeconomic History   Marital status: Single    Spouse name: Not on file   Number of children: Not on file   Years of education: Not on file   Highest education level: Not on file  Occupational History   Not on file  Tobacco Use   Smoking status: Never    Passive exposure: Never   Smokeless tobacco: Never  Vaping Use   Vaping status: Never Used  Substance and Sexual Activity   Alcohol use: Never   Drug use: Never   Sexual activity: Not on file  Other Topics Concern   Not on file  Social History Narrative   Not on file   Social Drivers of Health    Financial Resource Strain: Not on file  Food Insecurity: Not on file  Transportation Needs: Not on file  Physical Activity: Not on file  Stress: Not on file  Social Connections: Not on file  Intimate Partner Violence: Not on file    Outpatient Medications Prior to Visit  Medication Sig   Adapalene  (DIFFERIN ) 0.3 % gel Apply pea sized amount to face nightly as tolerated   albuterol  (VENTOLIN  HFA) 108 (90 Base) MCG/ACT inhaler Inhale 2 puffs into the lungs every 6 (six) hours as needed for wheezing or shortness of breath.   celecoxib (CELEBREX) 200 MG capsule Take 200 mg by mouth daily.   cetirizine  (ZYRTEC ) 10 MG tablet Take 1 tablet (10 mg total) by mouth daily.   doxycycline  (MONODOX ) 100 MG capsule TAKE 1 CAPSULE BY MOUTH DAILY WITH FOOD   EPIPEN  2-PAK 0.3 MG/0.3ML SOAJ injection Use as directed for life threatening allergic reactions   famotidine  (PEPCID ) 20 MG tablet Take 1 tablet (20 mg total) by mouth daily.   fluticasone  (FLONASE ) 50 MCG/ACT nasal spray Place 2 sprays into both nostrils 2 (two) times daily as needed for allergies or rhinitis.   magnesium 30 MG tablet Take 30 mg by mouth daily.   pantoprazole  (PROTONIX ) 40 MG tablet  Take 1 tablet (40 mg total) by mouth daily as needed.   No facility-administered medications prior to visit.    Review of Systems  Constitutional: Negative.   HENT: Negative.    Eyes: Negative.   Respiratory: Negative.  Negative for shortness of breath.   Cardiovascular: Negative.  Negative for chest pain.  Gastrointestinal: Negative.  Negative for abdominal pain, constipation and diarrhea.  Genitourinary: Negative.   Musculoskeletal:  Negative for joint pain and myalgias.  Skin: Negative.   Neurological: Negative.  Negative for dizziness and headaches.  Endo/Heme/Allergies: Negative.   All other systems reviewed and are negative.       Objective:     BP 128/86 (BP Location: Right Arm, Patient Position: Sitting, Cuff Size: Large)    Pulse 90   Ht 6' (1.829 m)   Wt 265 lb (120.2 kg)   SpO2 98%   BMI 35.94 kg/m  BP Readings from Last 3 Encounters:  03/05/24 128/86  11/24/23 (!) 132/92  07/04/23 (!) 148/70      Physical Exam Nursing note reviewed.  Constitutional:      Appearance: Normal appearance. He is normal weight.  HENT:     Head: Normocephalic and atraumatic.     Nose: Nose normal.     Mouth/Throat:     Mouth: Mucous membranes are moist.     Pharynx: Oropharynx is clear.  Eyes:     Extraocular Movements: Extraocular movements intact.     Conjunctiva/sclera: Conjunctivae normal.     Pupils: Pupils are equal, round, and reactive to light.  Cardiovascular:     Rate and Rhythm: Normal rate and regular rhythm.     Pulses: Normal pulses.     Heart sounds: Normal heart sounds.  Pulmonary:     Effort: Pulmonary effort is normal.     Breath sounds: Normal breath sounds.  Abdominal:     General: Abdomen is flat. Bowel sounds are normal.     Palpations: Abdomen is soft.  Musculoskeletal:        General: Normal range of motion.     Cervical back: Normal range of motion.  Skin:    General: Skin is warm and dry.  Neurological:     General: No focal deficit present.     Mental Status: He is alert and oriented to person, place, and time.  Psychiatric:        Mood and Affect: Mood normal.        Behavior: Behavior normal.        Thought Content: Thought content normal.        Judgment: Judgment normal.      No results found for any visits on 03/05/24.  Recent Results (from the past 2160 hours)  CMP14+EGFR     Status: Abnormal   Collection Time: 03/02/24  3:29 PM  Result Value Ref Range   Glucose 85 70 - 99 mg/dL   BUN 13 6 - 20 mg/dL   Creatinine, Ser 8.66 (H) 0.76 - 1.27 mg/dL   eGFR 78 >40 fO/fpw/8.26   BUN/Creatinine Ratio 10 9 - 20   Sodium 142 134 - 144 mmol/L   Potassium 4.1 3.5 - 5.2 mmol/L   Chloride 104 96 - 106 mmol/L   CO2 23 20 - 29 mmol/L   Calcium 9.3 8.7 - 10.2 mg/dL   Total  Protein 7.1 6.0 - 8.5 g/dL   Albumin 4.4 4.3 - 5.2 g/dL   Globulin, Total 2.7 1.5 - 4.5 g/dL   Bilirubin Total  0.4 0.0 - 1.2 mg/dL   Alkaline Phosphatase 61 44 - 121 IU/L    Comment: **Effective March 12, 2024 Alkaline Phosphatase**   reference interval will be changing to:              Age                Male          Male           0 -  5 days         47 - 127       47 - 127           6 - 10 days         29 - 242       29 - 242          11 - 20 days        109 - 357      109 - 357          21 - 30 days         94 - 494       94 - 494           1 -  2 months      149 - 539      149 - 539           3 -  6 months      131 - 452      131 - 452           7 - 11 months      117 - 401      117 - 401   12 months -  6 years       158 - 369      158 - 369           7 - 12 years       150 - 409      150 - 409               13 years       156 - 435       78 - 227               14 years       114 - 375       64 - 161               15 years        88 - 279       56 - 134               16 years        74 - 207       51 - 121               17 years        63 - 161       47 - 113          18 - 20 years        51 - 125       42 - 106          21 - 50 years         47 - 123       41 - 116  51 - 80 years        49 - 135       51 - 125              >80 years        48 - 129       48 - 129    AST 25 0 - 40 IU/L   ALT 23 0 - 44 IU/L  Hemoglobin A1c     Status: None   Collection Time: 03/02/24  3:29 PM  Result Value Ref Range   Hgb A1c MFr Bld 5.4 4.8 - 5.6 %    Comment:          Prediabetes: 5.7 - 6.4          Diabetes: >6.4          Glycemic control for adults with diabetes: <7.0    Est. average glucose Bld gHb Est-mCnc 108 mg/dL  Lipid panel     Status: None   Collection Time: 03/02/24  3:29 PM  Result Value Ref Range   Cholesterol, Total 119 100 - 199 mg/dL   Triglycerides 30 0 - 149 mg/dL   HDL 61 >60 mg/dL   VLDL Cholesterol Cal 9 5 - 40 mg/dL   LDL Chol Calc (NIH) 49 0 -  99 mg/dL   Chol/HDL Ratio 2.0 0.0 - 5.0 ratio    Comment:                                   T. Chol/HDL Ratio                                             Men  Women                               1/2 Avg.Risk  3.4    3.3                                   Avg.Risk  5.0    4.4                                2X Avg.Risk  9.6    7.1                                3X Avg.Risk 23.4   11.0   TSH     Status: None   Collection Time: 03/02/24  3:29 PM  Result Value Ref Range   TSH 0.755 0.450 - 4.500 uIU/mL        Assessment & Plan:    Routine Health Maintenance and Physical Exam   There is no immunization history on file for this patient.  Health Maintenance  Topic Date Due   COVID-19 Vaccine (1) Never done   HPV VACCINES (1 - Male 3-dose series) Never done   HIV Screening  Never done   Meningococcal B Vaccine (1 of 2 - Standard) Never done   Hepatitis C Screening  Never done   DTaP/Tdap/Td (1 - Tdap)  Never done   Hepatitis B Vaccines 19-59 Average Risk (1 of 3 - 19+ 3-dose series) Never done   Influenza Vaccine  Never done   Pneumococcal Vaccine  Aged Out    Discussed health benefits of physical activity, and encouraged him to engage in regular exercise appropriate for his age and condition.  Problem List Items Addressed This Visit       Other   Encounter for annual health examination - Primary   Return in about 1 year (around 03/05/2025).     Jeoffrey Pollen, NP  03/05/2024   This document may have been prepared by Dragon Voice Recognition software and as such may include unintentional dictation errors.

## 2024-03-08 ENCOUNTER — Ambulatory Visit: Admitting: Allergy

## 2024-03-12 NOTE — Patient Instructions (Incomplete)
 Adverse food reaction - continue avoidance of nuts, dairy (in moderation) in the diet. - have access to self-injectable epinephrine  (Epipen  or AuviQ) 0.3mg  at all times. - follow emergency action plan in case of allergic reaction. Discussed today there is a new nasal epinephrine  spray (Neffy) option available for allergic reaction treatment.  Let me know if you are interested in having this option.   - we have previously discussed Xolair injections as a newer FDA indicated medication for food allergy management if we have accidental ingestions or reactions frequently  - we have previously discussed the following in regards to foods:   Allergy: food allergy is when you have eaten a food, developed an allergic reaction after eating the food and have IgE to the food (positive food testing either by skin testing or blood testing).  Food allergy could lead to life threatening symptoms  Sensitivity: occurs when you have IgE to a food (positive food testing either by skin testing or blood testing) but is a food you eat without any issues.  This is not an allergy and we recommend keeping the food in the diet  Intolerance: this is when you have negative testing by either skin testing or blood testing thus not allergic but the food causes symptoms (like belly pain, bloating, diarrhea etc) with ingestion.  These foods should be avoided to prevent symptoms.    Chronic hives  - at this time etiology of hives and swelling is spontaneous in nature but increase in body temperature does flare hives.  Hives can be caused by a variety of different triggers including illness/infection, foods, medications, stings, exercise, pressure, vibrations, extremes of temperature to name a few however majority of the time there is no identifiable trigger.   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    Environmental allergy - continue avoidance measures for mold, dust mites, tree pollen, dog dander and weed pollen - Zyrtec  daily as above for symptom control - use Flonase  2 sprays each nostril daily for 1-2 weeks at a time before stopping once nasal congestion improves for maximum benefit   Follow-up in  months or sooner if needed

## 2024-03-13 ENCOUNTER — Ambulatory Visit: Admitting: Family

## 2024-03-19 ENCOUNTER — Ambulatory Visit (INDEPENDENT_AMBULATORY_CARE_PROVIDER_SITE_OTHER): Admitting: Internal Medicine

## 2024-03-19 ENCOUNTER — Other Ambulatory Visit: Payer: Self-pay

## 2024-03-19 ENCOUNTER — Encounter: Payer: Self-pay | Admitting: Internal Medicine

## 2024-03-19 VITALS — BP 130/82 | HR 77 | Temp 98.3°F | Resp 19

## 2024-03-19 DIAGNOSIS — T781XXD Other adverse food reactions, not elsewhere classified, subsequent encounter: Secondary | ICD-10-CM | POA: Diagnosis not present

## 2024-03-19 DIAGNOSIS — L508 Other urticaria: Secondary | ICD-10-CM | POA: Insufficient documentation

## 2024-03-19 DIAGNOSIS — J3089 Other allergic rhinitis: Secondary | ICD-10-CM | POA: Diagnosis not present

## 2024-03-19 DIAGNOSIS — J4520 Mild intermittent asthma, uncomplicated: Secondary | ICD-10-CM | POA: Diagnosis not present

## 2024-03-19 DIAGNOSIS — H1013 Acute atopic conjunctivitis, bilateral: Secondary | ICD-10-CM | POA: Insufficient documentation

## 2024-03-19 DIAGNOSIS — J309 Allergic rhinitis, unspecified: Secondary | ICD-10-CM | POA: Insufficient documentation

## 2024-03-19 DIAGNOSIS — H6122 Impacted cerumen, left ear: Secondary | ICD-10-CM

## 2024-03-19 MED ORDER — EPIPEN 2-PAK 0.3 MG/0.3ML IJ SOAJ: INTRAMUSCULAR | 1 refills | Status: AC

## 2024-03-19 MED ORDER — FAMOTIDINE 20 MG PO TABS: 20.0000 mg | ORAL_TABLET | Freq: Every day | ORAL | 2 refills | Status: AC | PRN

## 2024-03-19 MED ORDER — CETIRIZINE HCL 10 MG PO TABS: 10.0000 mg | ORAL_TABLET | Freq: Every day | ORAL | 2 refills | Status: AC | PRN

## 2024-03-19 MED ORDER — ALBUTEROL SULFATE HFA 108 (90 BASE) MCG/ACT IN AERS: 2.0000 | INHALATION_SPRAY | RESPIRATORY_TRACT | 2 refills | Status: AC | PRN

## 2024-03-19 MED ORDER — AZELASTINE-FLUTICASONE 137-50 MCG/ACT NA SUSP
2.0000 | Freq: Two times a day (BID) | NASAL | 5 refills | Status: DC | PRN
Start: 2024-03-19 — End: 2024-03-20

## 2024-03-19 NOTE — Progress Notes (Signed)
 FOLLOW UP Date of Service/Encounter:   03/19/2024  Subjective:  Derek Holland (DOB: [birth year]) is a 23 y.o. male who [facility] in re-evaluation of the following: urticaria, food allergy, allergic rhinitis  History obtained from: chart review and patient.  For Review, LV was on 11/24/23  with Dr. Jeneal seen for routine follow-up. This is my first encounter with this patient.  Followed for chronic hives managed with pepcid  and zyrtec  once daily. Reported trying to decrease frequency with recurrence of hives at last OV. History of environmental allergies to mold, dust mites, tree pollen, dog, and wheat pollen, leading to daily nasal stuffiness  Avoiding nuts due to allergies; consumes dairy in moderation. --------------------------------------------------- Today presents for follow-up. Discussed the use of AI scribe software for clinical note transcription with the patient, who gave verbal consent to proceed.  History of Present Illness Derek Holland is a 23 year old male who presents for follow-up of his allergic conditions. He was last evaluated by Dr. Jeneal for follow-up of his allergic conditions.  Allergic rhinitis and environmental allergies - Nasal congestion attributed to environmental allergies - Uses Flonase  nasal spray; used daily for two weeks, which improved nasal moisture but did not significantly relieve congestion - Congestion is currently almost resolved  Cutaneous allergic symptoms - Manages skin irritation and pruritus with Zyrtec  and Pepcid , primarily taken at night - Occasionally spaces out doses to every other day or skips for two to three days, resulting in skin irritation and itching after the third day without medication  Food allergies and anaphylaxis risk - Avoids nuts and consumes dairy in moderation - Possesses an EpiPen , never used - Strong aversion to the smell of peanut butter  Respiratory symptoms and  bronchodilator use - History of significant wheezing during an illness last year, initially prescribed albuterol  at that time - Uses albuterol  during intense workouts to manage shortness of breath, but not consistently - No formal diagnosis of asthma, but lifelong history of shortness of breath - Approximately 75 doses remaining in current inhaler   All medications reviewed by clinical staff and updated in chart. No new pertinent medical or surgical history except as noted in HPI.  ROS: All others negative except as noted per HPI.   Objective:  BP 130/82   Pulse 77   Temp 98.3 F (36.8 C)   Resp 19   SpO2 99%  There is no height or weight on file to calculate BMI. Physical Exam: General Appearance:  Alert, cooperative, no distress, appears stated age  Head:  Normocephalic, without obvious abnormality, atraumatic  Eyes:  Conjunctiva clear, EOM's intact  Ears Right TM normal, left TM not visible due to impacted cerumen and EACs normal bilaterally  Nose: Nares normal, normal mucosa and no visible anterior polyps  Throat: Lips, tongue normal; teeth and gums normal, normal posterior oropharynx  Neck: Supple, symmetrical  Lungs:   clear to auscultation bilaterally, Respirations unlabored, no coughing  Heart:  regular rate and rhythm and no murmur, Appears well perfused  Extremities: No edema  Skin: Skin color, texture, turgor normal and no rashes or lesions on visualized portions of skin  Neurologic: No gross deficits   Labs:  Lab Orders  No laboratory test(s) ordered today    Spirometry:  Tracings reviewed. His effort: It was hard to get consistent efforts and there is a question as to whether this reflects a maximal maneuver. FVC: 6.0L FEV1: 4.57L, 107% predicted FEV1/FVC ratio:  0.76 Interpretation: Spirometry consistent with normal pattern.  Please see scanned spirometry results for details.   Assessment/Plan   Assessment and Plan  Adverse food reaction - continue  avoidance of nuts, dairy (in moderation) in the diet. - have access to self-injectable epinephrine  (Epipen  or AuviQ) 0.3mg  at all times. - follow emergency action plan in case of allergic reaction. Discussed today there is a new nasal epinephrine  spray (Neffy) option available for allergic reaction treatment.  Let me know if you are interested in having this option.   - we have previously discussed Xolair injections as a newer FDA indicated medication for food allergy management if we have accidental ingestions or reactions frequently  - we have previously discussed the following in regards to foods:   Allergy: food allergy is when you have eaten a food, developed an allergic reaction after eating the food and have IgE to the food (positive food testing either by skin testing or blood testing).  Food allergy could lead to life threatening symptoms  Sensitivity: occurs when you have IgE to a food (positive food testing either by skin testing or blood testing) but is a food you eat without any issues.  This is not an allergy and we recommend keeping the food in the diet  Intolerance: this is when you have negative testing by either skin testing or blood testing thus not allergic but the food causes symptoms (like belly pain, bloating, diarrhea etc) with ingestion.  These foods should be avoided to prevent symptoms.    Chronic hives  - at this time etiology of hives and swelling is spontaneous in nature but increase in body temperature does flare hives.  Hives can be caused by a variety of different triggers including illness/infection, foods, medications, stings, exercise, pressure, vibrations, extremes of temperature to name a few however majority of the time there is no identifiable trigger.   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    Environmental allergy - continue avoidance measures for mold, dust mites, tree pollen, dog dander and weed pollen - Zyrtec  daily as above for symptom control - discontinue flonase  - start Dymista  nasal spray-1 spray up to twice daily as needed for nasal congestion  Possible mild asthma Wheezing and shortness of breath, particularly during illness and intense exercise. Albuterol  provides relief, suggesting possible mild asthma. No formal diagnosis or pulmonary function test has been conducted. - your lung testing today looked great - Rescue Inhaler: Albuterol  (Proair /Ventolin ) 2 puffs . Use  every 4-6 hours as needed for chest tightness, wheezing, or coughing.  + Can also use 15 minutes prior to exercise if you have symptoms with activity. - Asthma is not controlled if:  - Symptoms are occurring >2 times a week OR  - >2 times a month nighttime awakenings  - You are requiring systemic steroids (prednisone/steroid injections) more than once per year  - Your require hospitalization for your asthma.  - Please call the clinic to schedule a follow up if these symptoms arise Avoid smoke exposure Stay up-to-date with your annual flu vaccines, COVID vaccines and pneumonia vaccines when indicated.  Impacted cerumen, left ear Significant wax buildup in the left ear, potentially affecting hearing. - Recommend over-the-counter D drops to soften earwax - Advise against using Q-tips to prevent  impaction  Follow-up in 6 months or sooner if needed It was a pleasure meeting you in clinic today!  Other: none  Rocky Endow, MD  [facility] 

## 2024-03-19 NOTE — Patient Instructions (Addendum)
 Adverse food reaction - continue avoidance of nuts, dairy (in moderation) in the diet. - have access to self-injectable epinephrine  (Epipen  or AuviQ) 0.3mg  at all times. - follow emergency action plan in case of allergic reaction. Discussed today there is a new nasal epinephrine  spray (Neffy) option available for allergic reaction treatment.  Let me know if you are interested in having this option.   - we have previously discussed Xolair injections as a newer FDA indicated medication for food allergy management if we have accidental ingestions or reactions frequently  - we have previously discussed the following in regards to foods:   Allergy: food allergy is when you have eaten a food, developed an allergic reaction after eating the food and have IgE to the food (positive food testing either by skin testing or blood testing).  Food allergy could lead to life threatening symptoms  Sensitivity: occurs when you have IgE to a food (positive food testing either by skin testing or blood testing) but is a food you eat without any issues.  This is not an allergy and we recommend keeping the food in the diet  Intolerance: this is when you have negative testing by either skin testing or blood testing thus not allergic but the food causes symptoms (like belly pain, bloating, diarrhea etc) with ingestion.  These foods should be avoided to prevent symptoms.    Chronic hives  - at this time etiology of hives and swelling is spontaneous in nature but increase in body temperature does flare hives.  Hives can be caused by a variety of different triggers including illness/infection, foods, medications, stings, exercise, pressure, vibrations, extremes of temperature to name a few however majority of the time there is no identifiable trigger.   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    Environmental allergy - continue avoidance measures for mold, dust mites, tree pollen, dog dander and weed pollen - Zyrtec  daily as above for symptom control - discontinue flonase  - start Dymista  nasal spray-1 spray up to twice daily as needed for nasal congestion  Possible mild asthma Wheezing and shortness of breath, particularly during illness and intense exercise. Albuterol  provides relief, suggesting possible mild asthma. No formal diagnosis or pulmonary function test has been conducted. - your lung testing today looked great - Rescue Inhaler: Albuterol  (Proair /Ventolin ) 2 puffs . Use  every 4-6 hours as needed for chest tightness, wheezing, or coughing.  + Can also use 15 minutes prior to exercise if you have symptoms with activity. - Asthma is not controlled if:  - Symptoms are occurring >2 times a week OR  - >2 times a month nighttime awakenings  - You are requiring systemic steroids (prednisone/steroid injections) more than once per year  - Your require hospitalization for your asthma.  - Please call the clinic to schedule a follow up if these symptoms arise Avoid smoke exposure Stay up-to-date with your annual flu vaccines, COVID vaccines and pneumonia vaccines when indicated.  Impacted cerumen, left ear Significant wax buildup in the left ear, potentially affecting hearing. - Recommend over-the-counter D drops to soften earwax - Advise against using Q-tips to prevent impaction  Follow-up in 6 months or sooner if needed It was a pleasure meeting you in clinic today! Thank you for allowing me to  participate in your care.  Rocky Endow, MD [facility]

## 2024-03-20 ENCOUNTER — Other Ambulatory Visit: Payer: Self-pay

## 2024-03-20 MED ORDER — DYMISTA 137-50 MCG/ACT NA SUSP: 2.0000 | Freq: Two times a day (BID) | NASAL | 2 refills | Status: AC | PRN

## 2024-05-21 ENCOUNTER — Ambulatory Visit
Admission: RE | Admit: 2024-05-21 | Discharge: 2024-05-21 | Disposition: A | Source: Ambulatory Visit | Attending: Cardiology

## 2024-05-21 ENCOUNTER — Ambulatory Visit
Admission: RE | Admit: 2024-05-21 | Discharge: 2024-05-21 | Disposition: A | Discharge status: Home / Self Care | Attending: Cardiology | Admitting: Cardiology

## 2024-05-21 ENCOUNTER — Encounter: Payer: Self-pay | Admitting: Cardiology

## 2024-05-21 ENCOUNTER — Ambulatory Visit: Admitting: Cardiology

## 2024-05-21 VITALS — BP 124/88 | HR 68 | Ht 72.0 in | Wt 274.0 lb

## 2024-05-21 DIAGNOSIS — E66812 Obesity, class 2: Secondary | ICD-10-CM | POA: Insufficient documentation

## 2024-05-21 DIAGNOSIS — Z131 Encounter for screening for diabetes mellitus: Secondary | ICD-10-CM

## 2024-05-21 DIAGNOSIS — M25531 Pain in right wrist: Secondary | ICD-10-CM | POA: Insufficient documentation

## 2024-05-21 DIAGNOSIS — Z0130 Encounter for examination of blood pressure without abnormal findings: Secondary | ICD-10-CM

## 2024-05-21 NOTE — Progress Notes (Signed)
 Established Patient Office Visit  Subjective:  Patient ID: Derek Holland, male    DOB: 01/18/2001  Age: 23 y.o. MRN: [id number]  Chief Complaint  Patient presents with   Acute Visit    Right wrist pain started 2 months ago punched a punching bag the wrong way and twisted his wrist and heard it click, has iced it no improvement, depending on movement when lifting causes more pain.     Patient in office for an acute visit. Patient complaining of wrist pain. Patient reports hitting a punching bag about 2 months ago, heard a click. Since then, he has been experiencing right wrist pain, worse with activity. Patient taking Celebrex daily for another issue. Has also been using Aleve with no relief. Will order an xray of the right wrist to rule out fracture. Recommend Tylenol for pain.   Wrist Pain  The pain is present in the right wrist. This is a new problem. The current episode started more than 1 month ago. There has been a history of trauma. The problem occurs constantly. The problem has been unchanged. The quality of the pain is described as sharp. The pain is mild. Associated symptoms include an inability to bear weight and a limited range of motion. Pertinent negatives include no numbness or tingling. The symptoms are aggravated by activity. He has tried NSAIDS for the symptoms. The treatment provided no relief.    No other concerns at this time.   Past Medical History:  Diagnosis Date   Eczema    Urticaria     Past Surgical History:  Procedure Laterality Date   COLONOSCOPY WITH PROPOFOL  N/A 03/27/2021   Procedure: COLONOSCOPY WITH PROPOFOL ;  Surgeon: Janalyn Keene NOVAK, MD;  Location: [facility] ENDOSCOPY;  Service: Endoscopy;  Laterality: N/A;   ESOPHAGOGASTRODUODENOSCOPY N/A 03/27/2021   Procedure: ESOPHAGOGASTRODUODENOSCOPY (EGD);  Surgeon: Janalyn Keene NOVAK, MD;  Location: [facility] ENDOSCOPY;  Service: Endoscopy;  Laterality: N/A;   WISDOM TOOTH EXTRACTION      Social History    Socioeconomic History   Marital status: Single    Spouse name: Not on file   Number of children: Not on file   Years of education: Not on file   Highest education level: Not on file  Occupational History   Not on file  Tobacco Use   Smoking status: Never    Passive exposure: Never   Smokeless tobacco: Never  Vaping Use   Vaping status: Never Used  Substance and Sexual Activity   Alcohol use: Never   Drug use: Never   Sexual activity: Not on file  Other Topics Concern   Not on file  Social History Narrative   Not on file   Social Drivers of Health   Financial Resource Strain: Not on file  Food Insecurity: Not on file  Transportation Needs: Not on file  Physical Activity: Not on file  Stress: Not on file  Social Connections: Not on file  Intimate Partner Violence: Not on file    History reviewed. No pertinent family history.  Allergies  Allergen Reactions   Other     Tree nut   Peanut-Containing Drug Products     Outpatient Medications Prior to Visit  Medication Sig   Adapalene  (DIFFERIN ) 0.3 % gel Apply pea sized amount to face nightly as tolerated   albuterol  (VENTOLIN  HFA) 108 (90 Base) MCG/ACT inhaler Inhale 2 puffs into the lungs every 4 (four) hours as needed for wheezing or shortness of breath.   celecoxib (CELEBREX) 200  MG capsule Take 200 mg by mouth daily.   cetirizine  (ZYRTEC ) 10 MG tablet Take 1 tablet (10 mg total) by mouth daily as needed for allergies.   doxycycline  (MONODOX ) 100 MG capsule TAKE 1 CAPSULE BY MOUTH DAILY WITH FOOD   DYMISTA  137-50 MCG/ACT SUSP Place 2 sprays into the nose 2 (two) times daily as needed.   EPIPEN  2-PAK 0.3 MG/0.3ML SOAJ injection Use as directed for life threatening allergic reactions   famotidine  (PEPCID ) 20 MG tablet Take 1 tablet (20 mg total) by mouth daily as needed for heartburn or indigestion.   magnesium 30 MG tablet Take 30 mg by mouth daily.   pantoprazole  (PROTONIX ) 40 MG tablet Take 1 tablet (40 mg  total) by mouth daily as needed.   No facility-administered medications prior to visit.    Review of Systems  Constitutional: Negative.   HENT: Negative.    Eyes: Negative.   Respiratory: Negative.  Negative for shortness of breath.   Cardiovascular: Negative.  Negative for chest pain.  Gastrointestinal: Negative.  Negative for abdominal pain, constipation and diarrhea.  Genitourinary: Negative.   Musculoskeletal:  Positive for joint pain. Negative for myalgias.  Skin: Negative.   Neurological: Negative.  Negative for dizziness, tingling, numbness and headaches.  Endo/Heme/Allergies: Negative.   All other systems reviewed and are negative.      Objective:   BP 124/88   Pulse 68   Ht 6' (1.829 m)   Wt 274 lb (124.3 kg)   SpO2 98%   BMI 37.16 kg/m   Vitals:   05/21/24 1012  BP: 124/88  Pulse: 68  Height: 6' (1.829 m)  Weight: 274 lb (124.3 kg)  SpO2: 98%  BMI (Calculated): 37.15    Physical Exam Nursing note reviewed.  Constitutional:      Appearance: Normal appearance. He is normal weight.  HENT:     Head: Normocephalic and atraumatic.     Nose: Nose normal.     Mouth/Throat:     Mouth: Mucous membranes are moist.     Pharynx: Oropharynx is clear.  Eyes:     Extraocular Movements: Extraocular movements intact.     Conjunctiva/sclera: Conjunctivae normal.     Pupils: Pupils are equal, round, and reactive to light.  Cardiovascular:     Rate and Rhythm: Normal rate and regular rhythm.     Pulses: Normal pulses.     Heart sounds: Normal heart sounds.  Pulmonary:     Effort: Pulmonary effort is normal.     Breath sounds: Normal breath sounds.  Abdominal:     General: Abdomen is flat. Bowel sounds are normal.     Palpations: Abdomen is soft.  Musculoskeletal:     Cervical back: Normal range of motion.  Skin:    General: Skin is warm and dry.  Neurological:     General: No focal deficit present.     Mental Status: He is alert and oriented to person,  place, and time.  Psychiatric:        Mood and Affect: Mood normal.        Behavior: Behavior normal.        Thought Content: Thought content normal.        Judgment: Judgment normal.      No results found for any visits on 05/21/24.  Recent Results (from the past 2160 hours)  CMP14+EGFR     Status: Abnormal   Collection Time: 03/02/24  3:29 PM  Result Value Ref Range   Glucose  85 70 - 99 mg/dL   BUN 13 6 - 20 mg/dL   Creatinine, Ser 8.66 (H) 0.76 - 1.27 mg/dL   eGFR 78 >40 fO/fpw/8.26   BUN/Creatinine Ratio 10 9 - 20   Sodium 142 134 - 144 mmol/L   Potassium 4.1 3.5 - 5.2 mmol/L   Chloride 104 96 - 106 mmol/L   CO2 23 20 - 29 mmol/L   Calcium 9.3 8.7 - 10.2 mg/dL   Total Protein 7.1 6.0 - 8.5 g/dL   Albumin 4.4 4.3 - 5.2 g/dL   Globulin, Total 2.7 1.5 - 4.5 g/dL   Bilirubin Total 0.4 0.0 - 1.2 mg/dL   Alkaline Phosphatase 61 44 - 121 IU/L    Comment: **Effective March 12, 2024 Alkaline Phosphatase**   reference interval will be changing to:              Age                Male          Male           0 -  5 days         47 - 127       47 - 127           6 - 10 days         29 - 242       29 - 242          11 - 20 days        109 - 357      109 - 357          21 - 30 days         94 - 494       94 - 494           1 -  2 months      149 - 539      149 - 539           3 -  6 months      131 - 452      131 - 452           7 - 11 months      117 - 401      117 - 401   12 months -  6 years       158 - 369      158 - 369           7 - 12 years       150 - 409      150 - 409               13 years       156 - 435       78 - 227               14 years       114 - 375       64 - 161               15 years        88 - 279       56 - 134               16 years        74 - 207       51 - 121  17 years        63 - 161       47 - 113          18 - 20 years        51 - 125       42 - 106          21 - 50 years         47 - 123       41 - 116          51 - 80  years        49 - 135       51 - 125              >80 years        48 - 129       48 - 129    AST 25 0 - 40 IU/L   ALT 23 0 - 44 IU/L  Hemoglobin A1c     Status: None   Collection Time: 03/02/24  3:29 PM  Result Value Ref Range   Hgb A1c MFr Bld 5.4 4.8 - 5.6 %    Comment:          Prediabetes: 5.7 - 6.4          Diabetes: >6.4          Glycemic control for adults with diabetes: <7.0    Est. average glucose Bld gHb Est-mCnc 108 mg/dL  Lipid panel     Status: None   Collection Time: 03/02/24  3:29 PM  Result Value Ref Range   Cholesterol, Total 119 100 - 199 mg/dL   Triglycerides 30 0 - 149 mg/dL   HDL 61 >60 mg/dL   VLDL Cholesterol Cal 9 5 - 40 mg/dL   LDL Chol Calc (NIH) 49 0 - 99 mg/dL   Chol/HDL Ratio 2.0 0.0 - 5.0 ratio    Comment:                                   T. Chol/HDL Ratio                                             Men  Women                               1/2 Avg.Risk  3.4    3.3                                   Avg.Risk  5.0    4.4                                2X Avg.Risk  9.6    7.1                                3X Avg.Risk 23.4   11.0   TSH     Status: None   Collection Time: 03/02/24  3:29 PM  Result Value Ref Range  TSH 0.755 0.450 - 4.500 uIU/mL      Assessment & Plan:  Right wrist xray Tylenol for pain  Problem List Items Addressed This Visit       Other   Right wrist pain - Primary   Relevant Orders   DG Wrist Complete Right    Return if symptoms worsen or fail to improve.   Total time spent: 25 minutes. This time includes review of previous notes and results and patient face to face interaction during today's visit.    Jeoffrey Pollen, NP  05/21/2024   This document may have been prepared by Dragon Voice Recognition software and as such may include unintentional dictation errors.

## 2024-05-25 ENCOUNTER — Ambulatory Visit: Payer: Self-pay | Admitting: Cardiology

## 2024-05-25 NOTE — Progress Notes (Signed)
 Left message to return call

## 2024-05-25 NOTE — Progress Notes (Signed)
Pt informed

## 2024-07-03 ENCOUNTER — Other Ambulatory Visit: Payer: Self-pay | Admitting: Dermatology

## 2024-07-03 DIAGNOSIS — L700 Acne vulgaris: Secondary | ICD-10-CM

## 2024-07-06 ENCOUNTER — Other Ambulatory Visit: Payer: Self-pay

## 2024-07-06 MED ORDER — PANTOPRAZOLE SODIUM 40 MG PO TBEC: 40.0000 mg | DELAYED_RELEASE_TABLET | Freq: Every day | ORAL | 3 refills | Status: AC | PRN

## 2024-07-23 ENCOUNTER — Ambulatory Visit: Admitting: Dermatology

## 2024-07-26 ENCOUNTER — Ambulatory Visit: Payer: Self-pay | Admitting: Cardiology

## 2024-07-26 ENCOUNTER — Encounter: Payer: Self-pay | Admitting: Cardiology

## 2024-07-26 VITALS — BP 130/82 | HR 79 | Ht 72.0 in | Wt 282.0 lb

## 2024-07-26 DIAGNOSIS — R5383 Other fatigue: Secondary | ICD-10-CM

## 2024-07-26 DIAGNOSIS — Z131 Encounter for screening for diabetes mellitus: Secondary | ICD-10-CM | POA: Diagnosis not present

## 2024-07-26 DIAGNOSIS — Z0000 Encounter for general adult medical examination without abnormal findings: Secondary | ICD-10-CM

## 2024-07-26 DIAGNOSIS — Z0001 Encounter for general adult medical examination with abnormal findings: Secondary | ICD-10-CM | POA: Diagnosis not present

## 2024-07-26 DIAGNOSIS — Z1329 Encounter for screening for other suspected endocrine disorder: Secondary | ICD-10-CM | POA: Diagnosis not present

## 2024-07-26 DIAGNOSIS — Z0130 Encounter for examination of blood pressure without abnormal findings: Secondary | ICD-10-CM

## 2024-07-26 DIAGNOSIS — Z13220 Encounter for screening for lipoid disorders: Secondary | ICD-10-CM

## 2024-07-26 NOTE — Progress Notes (Signed)
 "  Complete physical exam  Patient: Derek Holland   DOB: 29-Jul-2000   24 y.o. Male  MRN: [id number]  Subjective:    Chief Complaint  Patient presents with   Annual Exam    Derek Holland is a 24 y.o. male who presents today for a complete physical exam. He reports consuming a general diet. Gym/ health club routine includes mod to heavy weightlifting. He generally feels well. He reports sleeping well. He does not have additional problems to discuss today.    Most recent fall risk assessment:     No data to display           Most recent depression screenings:     No data to display          Vision:Not within last year  and Dental: No current dental problems and Receives regular dental care  Past Medical History:  Diagnosis Date   Eczema    Urticaria     Past Surgical History:  Procedure Laterality Date   COLONOSCOPY WITH PROPOFOL  N/A 03/27/2021   Procedure: COLONOSCOPY WITH PROPOFOL ;  Surgeon: Janalyn Keene NOVAK, MD;  Location: [facility] ENDOSCOPY;  Service: Endoscopy;  Laterality: N/A;   ESOPHAGOGASTRODUODENOSCOPY N/A 03/27/2021   Procedure: ESOPHAGOGASTRODUODENOSCOPY (EGD);  Surgeon: Janalyn Keene NOVAK, MD;  Location[facility] ENDOSCOPY;  Service: Endoscopy;  Laterality: N/A;   WISDOM TOOTH EXTRACTION      History reviewed. No pertinent family history.  Social History   Socioeconomic History   Marital status: Single    Spouse name: Not on file   Number of children: Not on file   Years of education: Not on file   Highest education level: Not on file  Occupational History   Not on file  Tobacco Use   Smoking status: Never    Passive exposure: Never   Smokeless tobacco: Never  Vaping Use   Vaping status: Never Used  Substance and Sexual Activity   Alcohol use: Never   Drug use: Never   Sexual activity: Not on file  Other Topics Concern   Not on file  Social History Narrative   Not on file   Social Drivers of Health   Tobacco Use: Low Risk (07/26/2024)    Patient History    Smoking Tobacco Use: Never    Smokeless Tobacco Use: Never    Passive Exposure: Never  Financial Resource Strain: Not on file  Food Insecurity: Not on file  Transportation Needs: Not on file  Physical Activity: Not on file  Stress: Not on file  Social Connections: Not on file  Intimate Partner Violence: Not on file  Depression (EYV7-0): Not on file  Alcohol Screen: Not on file  Housing: Not on file  Utilities: Not on file  Health Literacy: Not on file    Show/hide medication list[1]  Review of Systems  Constitutional: Negative.   HENT: Negative.    Eyes: Negative.   Respiratory: Negative.  Negative for shortness of breath.   Cardiovascular: Negative.  Negative for chest pain.  Gastrointestinal: Negative.  Negative for abdominal pain, constipation and diarrhea.  Genitourinary: Negative.   Musculoskeletal:  Negative for joint pain and myalgias.  Skin: Negative.   Neurological: Negative.  Negative for dizziness and headaches.  Endo/Heme/Allergies: Negative.  Negative for environmental allergies.  All other systems reviewed and are negative.       Objective:     BP 130/82   Pulse 79   Ht 6' (1.829 m)   Wt 282 lb (127.9 kg)  SpO2 97%   BMI 38.25 kg/m  BP Readings from Last 3 Encounters:  07/26/24 130/82  05/21/24 124/88  03/19/24 130/82      Physical Exam Nursing note reviewed.  Constitutional:      Appearance: Normal appearance. He is normal weight.  HENT:     Head: Normocephalic and atraumatic.     Nose: Nose normal.     Mouth/Throat:     Mouth: Mucous membranes are moist.     Pharynx: Oropharynx is clear.  Eyes:     Extraocular Movements: Extraocular movements intact.     Conjunctiva/sclera: Conjunctivae normal.     Pupils: Pupils are equal, round, and reactive to light.  Cardiovascular:     Rate and Rhythm: Normal rate and regular rhythm.     Pulses: Normal pulses.     Heart sounds: Normal heart sounds.  Pulmonary:      Effort: Pulmonary effort is normal.     Breath sounds: Normal breath sounds.  Abdominal:     General: Abdomen is flat. Bowel sounds are normal.     Palpations: Abdomen is soft.  Musculoskeletal:        General: Normal range of motion.     Cervical back: Normal range of motion.  Skin:    General: Skin is warm and dry.  Neurological:     General: No focal deficit present.     Mental Status: He is alert and oriented to person, place, and time.  Psychiatric:        Mood and Affect: Mood normal.        Behavior: Behavior normal.        Thought Content: Thought content normal.        Judgment: Judgment normal.      No results found for any visits on 07/26/24.  No results found for this or any previous visit (from the past 2160 hours).      Assessment & Plan:    Routine Health Maintenance and Physical Exam   There is no immunization history on file for this patient.  Health Maintenance  Topic Date Due   COVID-19 Vaccine (1) Never done   HPV VACCINES (1 - Male 3-dose series) Never done   HIV Screening  Never done   Meningococcal B Vaccine (1 of 2 - Standard) Never done   Hepatitis C Screening  Never done   DTaP/Tdap/Td (1 - Tdap) Never done   Pneumococcal Vaccine (1 of 2 - PCV) Never done   Hepatitis B Vaccines 19-59 Average Risk (1 of 3 - 19+ 3-dose series) Never done   Influenza Vaccine  Never done    Discussed health benefits of physical activity, and encouraged him to engage in regular exercise appropriate for his age and condition.  Problem List Items Addressed This Visit       Other   Encounter for annual health examination - Primary   Other Visit Diagnoses       Diabetes mellitus screening       Relevant Orders   Hemoglobin A1c     Thyroid disorder screening       Relevant Orders   TSH     Lipid screening       Relevant Orders   Lipid Profile     Low energy       Relevant Orders   CMP14+EGFR   Testosterone,Free and Total   TSH   Hemoglobin  Solubility w/ Rflx to Frac      Return in about  6 months (around 01/23/2025) for fasting lab work prior.     Jeoffrey Pollen, NP  07/26/2024   This document may have been prepared by [facility] Voice Recognition software and as such may include unintentional dictation errors.     [1]  Outpatient Medications Prior to Visit  Medication Sig   Adapalene  (DIFFERIN ) 0.3 % gel Apply pea sized amount to face nightly as tolerated   albuterol  (VENTOLIN  HFA) 108 (90 Base) MCG/ACT inhaler Inhale 2 puffs into the lungs every 4 (four) hours as needed for wheezing or shortness of breath.   celecoxib (CELEBREX) 200 MG capsule Take 200 mg by mouth daily.   cetirizine  (ZYRTEC ) 10 MG tablet Take 1 tablet (10 mg total) by mouth daily as needed for allergies.   DYMISTA  137-50 MCG/ACT SUSP Place 2 sprays into the nose 2 (two) times daily as needed.   EPIPEN  2-PAK 0.3 MG/0.3ML SOAJ injection Use as directed for life threatening allergic reactions   magnesium 30 MG tablet Take 30 mg by mouth daily.   pantoprazole  (PROTONIX ) 40 MG tablet Take 1 tablet (40 mg total) by mouth daily as needed.   doxycycline  (MONODOX ) 100 MG capsule TAKE 1 CAPSULE BY MOUTH DAILY WITH FOOD   famotidine  (PEPCID ) 20 MG tablet Take 1 tablet (20 mg total) by mouth daily as needed for heartburn or indigestion.   No facility-administered medications prior to visit.   "

## 2024-07-31 ENCOUNTER — Other Ambulatory Visit

## 2024-07-31 DIAGNOSIS — Z13220 Encounter for screening for lipoid disorders: Secondary | ICD-10-CM

## 2024-07-31 DIAGNOSIS — R5383 Other fatigue: Secondary | ICD-10-CM

## 2024-07-31 DIAGNOSIS — Z1329 Encounter for screening for other suspected endocrine disorder: Secondary | ICD-10-CM

## 2024-07-31 DIAGNOSIS — Z131 Encounter for screening for diabetes mellitus: Secondary | ICD-10-CM

## 2024-08-03 LAB — LIPID PANEL
Chol/HDL Ratio: 1.8 ratio (ref 0.0–5.0)
Cholesterol, Total: 114 mg/dL (ref 100–199)
HDL: 62 mg/dL
LDL Chol Calc (NIH): 41 mg/dL (ref 0–99)
Triglycerides: 44 mg/dL (ref 0–149)
VLDL Cholesterol Cal: 11 mg/dL (ref 5–40)

## 2024-08-03 LAB — CMP14+EGFR
ALT: 22 [IU]/L (ref 0–44)
AST: 27 [IU]/L (ref 0–40)
Albumin: 4.2 g/dL — ABNORMAL LOW (ref 4.3–5.2)
Alkaline Phosphatase: 51 [IU]/L (ref 47–123)
BUN/Creatinine Ratio: 11 (ref 9–20)
BUN: 17 mg/dL (ref 6–20)
Bilirubin Total: 0.7 mg/dL (ref 0.0–1.2)
CO2: 25 mmol/L (ref 20–29)
Calcium: 9.4 mg/dL (ref 8.7–10.2)
Chloride: 105 mmol/L (ref 96–106)
Creatinine, Ser: 1.59 mg/dL — ABNORMAL HIGH (ref 0.76–1.27)
Globulin, Total: 2.6 g/dL (ref 1.5–4.5)
Glucose: 80 mg/dL (ref 70–99)
Potassium: 3.9 mmol/L (ref 3.5–5.2)
Sodium: 138 mmol/L (ref 134–144)
Total Protein: 6.8 g/dL (ref 6.0–8.5)
eGFR: 63 mL/min/{1.73_m2}

## 2024-08-03 LAB — HEMOGLOBIN A1C
Est. average glucose Bld gHb Est-mCnc: 111 mg/dL
Hgb A1c MFr Bld: 5.5 % (ref 4.8–5.6)

## 2024-08-03 LAB — TSH: TSH: 0.555 u[IU]/mL (ref 0.450–4.500)

## 2024-08-03 LAB — TESTOSTERONE,FREE AND TOTAL
Testosterone, Free: 23.6 pg/mL (ref 9.3–26.5)
Testosterone: 690 ng/dL (ref 264–916)

## 2024-08-03 LAB — HGB SOLU + RFLX FRAC: Sickle Solubility Test - HGBRFX: NEGATIVE

## 2024-08-20 ENCOUNTER — Ambulatory Visit: Admitting: Dermatology

## 2024-09-17 ENCOUNTER — Ambulatory Visit: Admitting: Internal Medicine
